# Patient Record
Sex: Male | Born: 1995 | Race: White | Hispanic: No | Marital: Single | Smoking: Current every day smoker
Health system: Southern US, Community
[De-identification: ages and names within clinical notes are randomized; demographics above are authoritative.]

## PROBLEM LIST (undated history)

## (undated) DIAGNOSIS — Q239 Congenital malformation of aortic and mitral valves, unspecified: Secondary | ICD-10-CM

## (undated) DIAGNOSIS — Q2381 Bicuspid aortic valve: Secondary | ICD-10-CM

## (undated) DIAGNOSIS — Q238 Other congenital malformations of aortic and mitral valves: Secondary | ICD-10-CM

## (undated) DIAGNOSIS — Q231 Congenital insufficiency of aortic valve: Secondary | ICD-10-CM

## (undated) HISTORY — PX: WISDOM TOOTH EXTRACTION: SHX21

## (undated) HISTORY — DX: Bicuspid aortic valve: Q23.81

## (undated) HISTORY — DX: Congenital insufficiency of aortic valve: Q23.1

---
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
## 2018-09-23 ENCOUNTER — Encounter: Payer: Self-pay | Admitting: Physician Assistant

## 2018-09-23 ENCOUNTER — Ambulatory Visit: Payer: Self-pay | Admitting: Physician Assistant

## 2018-11-15 NOTE — Progress Notes (Signed)
NEW PATIENT OV  Assessment and Plan: Michael Walker was seen today for new patient (initial visit).  Screen for STD (sexually transmitted disease) -     C. trachomatis/N. gonorrhoeae RNA -     RPR -     HIV Antibody (routine testing w rflx) -     HSV(herpes simplex vrs) 1+2 ab-IgG  Bicuspid aortic valve  no symptoms, no murmur, will monitor Need to monitor BP Follow up 6 months Will need echo eventually  Screening cholesterol level -     Lipid panel  Screening for diabetes mellitus -     Hemoglobin A1c  Vitamin D deficiency -     VITAMIN D 25 Hydroxy (Vit-D Deficiency, Fractures)  Medication management -     CBC with Differential/Platelet -     COMPLETE METABOLIC PANEL WITH GFR -     Magnesium  Screening for thyroid disorder -     TSH  Screening, anemia, deficiency, iron -     CBC with Differential/Platelet -     Iron,Total/Total Iron Binding Cap -     Vitamin B12 -     Ferritin  Screening for hematuria or proteinuria -     Urinalysis, Routine w reflex microscopic -     Microalbumin / creatinine urine ratio  Genital wart Rather large, will schedule to see Dr. Melford Aase for possible removal  Discussed med's effects and SE's. Screening labs and tests as requested with regular follow-up as recommended. Over 40 minutes of exam, counseling, chart review and critical decision making was performed  HPI Patient presents as new patient.  He has no issues.  He has history of bicuspid valve, no SOB, CP, dizziness.  He would like STD check. He is concerned for genital warts. No pain, noticed 2 weeks ago.   He did smoke on the way here, BP was up, recheck was 130/70.   Current Medications:  No current outpatient medications on file prior to visit.   No current facility-administered medications on file prior to visit.    Allergies:  No Known Allergies   Health Maintenance:  Immunization History  Administered Date(s) Administered  . Tdap 11/15/2009    Tetanus: 2011 Get  immuninizations from peds  DEXA: Colonoscopy: EGD:  Patient Care Team: Unk Pinto, MD as PCP - General (Internal Medicine)  Medical History:  has Bicuspid aortic valve on their problem list. Surgical History:  He  has a past surgical history that includes Tonsillectomy and adenoidectomy (2004) and Wisdom tooth extraction. Family History:  His family history includes Diabetes in his maternal grandmother and mother. Social History:   reports that he has never smoked. He has never used smokeless tobacco. He reports that he does not drink alcohol or use drugs.   Review of Systems:  Review of Systems  Constitutional: Negative.   HENT: Negative.   Eyes: Negative.   Respiratory: Negative.   Cardiovascular: Negative.   Gastrointestinal: Negative.   Genitourinary: Negative.   Musculoskeletal: Negative.   Skin: Negative.   Neurological: Negative.   Endo/Heme/Allergies: Negative.   Psychiatric/Behavioral: Negative.     Physical Exam: Estimated body mass index is 27.06 kg/m as calculated from the following:   Height as of this encounter: 5' 9.25" (1.759 m).   Weight as of this encounter: 184 lb 9.6 oz (83.7 kg). BP (!) 148/92   Pulse 80   Temp (!) 97.4 F (36.3 C)   Resp 16   Ht 5' 9.25" (1.759 m)   Wt 184 lb 9.6 oz (  83.7 kg)   BMI 27.06 kg/m  General Appearance: Well nourished, in no apparent distress.  Eyes: PERRLA, EOMs, conjunctiva no swelling or erythema, normal fundi and vessels.  Sinuses: No Frontal/maxillary tenderness  ENT/Mouth: Ext aud canals clear, normal light reflex with TMs without erythema, bulging. Good dentition. No erythema, swelling, or exudate on post pharynx. Tonsils not swollen or erythematous. Hearing normal.  Neck: Supple, thyroid normal. No bruits  Respiratory: Respiratory effort normal, BS equal bilaterally without rales, rhonchi, wheezing or stridor.  Cardio: RRR without murmurs, rubs or gallops. Brisk peripheral pulses without edema.   Chest: symmetric, with normal excursions and percussion.  Abdomen: Soft, nontender, no guarding, rebound, hernias, masses, or organomegaly.  Lymphatics: Non tender without lymphadenopathy.  Genitourinary: normal testicles, circumcised penis without discharge, on the underside of the penis with 5 mm pedunculated flesh colored nodule  Musculoskeletal: Full ROM all peripheral extremities,5/5 strength, and normal gait.  Skin: Warm, dry without rashes, lesions, ecchymosis. Neuro: Cranial nerves intact, reflexes equal bilaterally. Normal muscle tone, no cerebellar symptoms. Sensation intact.  Psych: Awake and oriented X 3, normal affect, Insight and Judgment appropriate.    Michael MullingAmanda Aniza Walker 12:15 PM [facility]

## 2018-11-16 ENCOUNTER — Ambulatory Visit (INDEPENDENT_AMBULATORY_CARE_PROVIDER_SITE_OTHER): Payer: Managed Care, Other (non HMO) | Admitting: Physician Assistant

## 2018-11-16 ENCOUNTER — Encounter: Payer: Self-pay | Admitting: Physician Assistant

## 2018-11-16 ENCOUNTER — Other Ambulatory Visit: Payer: Self-pay

## 2018-11-16 VITALS — BP 148/92 | HR 80 | Temp 97.4°F | Resp 16 | Ht 69.25 in | Wt 184.6 lb

## 2018-11-16 DIAGNOSIS — Z13220 Encounter for screening for lipoid disorders: Secondary | ICD-10-CM | POA: Diagnosis not present

## 2018-11-16 DIAGNOSIS — Q231 Congenital insufficiency of aortic valve: Secondary | ICD-10-CM | POA: Diagnosis not present

## 2018-11-16 DIAGNOSIS — Z79899 Other long term (current) drug therapy: Secondary | ICD-10-CM

## 2018-11-16 DIAGNOSIS — Z1389 Encounter for screening for other disorder: Secondary | ICD-10-CM

## 2018-11-16 DIAGNOSIS — E559 Vitamin D deficiency, unspecified: Secondary | ICD-10-CM

## 2018-11-16 DIAGNOSIS — Z1329 Encounter for screening for other suspected endocrine disorder: Secondary | ICD-10-CM

## 2018-11-16 DIAGNOSIS — Z131 Encounter for screening for diabetes mellitus: Secondary | ICD-10-CM

## 2018-11-16 DIAGNOSIS — Z130 Encounter for screening for diseases of the blood and blood-forming organs and certain disorders involving the immune mechanism: Secondary | ICD-10-CM

## 2018-11-16 DIAGNOSIS — Z113 Encounter for screening for infections with a predominantly sexual mode of transmission: Secondary | ICD-10-CM

## 2018-11-16 NOTE — Patient Instructions (Addendum)
Check out 7 min work out by [employer]  HYPERTENSION INFORMATION  Monitor your blood pressure at home, please keep a record and bring that in with you to your next office visit.   Go to the ER if any CP, SOB, nausea, dizziness, severe HA, changes vision/speech  Testing/Procedures: HOW TO TAKE YOUR BLOOD PRESSURE:  Rest 5 minutes before taking your blood pressure.  Don't smoke or drink caffeinated beverages for at least 30 minutes before.  Take your blood pressure before (not after) you eat.  Sit comfortably with your back supported and both feet on the floor (don't cross your legs).  Elevate your arm to heart level on a table or a desk.  Use the proper sized cuff. It should fit smoothly and snugly around your bare upper arm. There should be enough room to slip a fingertip under the cuff. The bottom edge of the cuff should be 1 inch above the crease of the elbow.  Due to a recent study, SPRINT, we have changed our goal for the systolic or top blood pressure number. Ideally we want your top number at 120.  In the [facility], 5000 people were randomized to a goal BP of 120 and 5000 people were randomized to a goal BP of less than 140. The patients with the goal BP at 120 had LESS DEMENTIA, LESS HEART ATTACKS, AND LESS STROKES, AS WELL AS OVERALL DECREASED MORTALITY OR DEATH RATE.   There was another study that showed taking your blood pressure medications at night decrease cardiovascular events.  However if you are on a fluid pill, please take this in the morning.   If you are willing, our goal BP is the top number of 120.  Your most recent BP: BP: (!) 148/92   Take your medications faithfully as instructed. Maintain a healthy weight. Get at least 150 minutes of aerobic exercise per week. Minimize salt intake. Minimize alcohol intake  DASH Eating Plan DASH stands for "Dietary Approaches to Stop Hypertension." The DASH eating plan is a healthy eating plan that has been  shown to reduce high blood pressure (hypertension). Additional health benefits may include reducing the risk of type 2 diabetes mellitus, heart disease, and stroke. The DASH eating plan may also help with weight loss. WHAT DO I NEED TO KNOW ABOUT THE DASH EATING PLAN? For the DASH eating plan, you will follow these general guidelines:  Choose foods with a percent daily value for sodium of less than 5% (as listed on the food label).  Use salt-free seasonings or herbs instead of table salt or sea salt.  Check with your health care provider or pharmacist before using salt substitutes.  Eat lower-sodium products, often labeled as "lower sodium" or "no salt added."  Eat fresh foods.  Eat more vegetables, fruits, and low-fat dairy products.  Choose whole grains. Look for the word "whole" as the first word in the ingredient list.  Choose fish and skinless chicken or [place] more often than red meat. Limit fish, poultry, and meat to 6 oz (170 g) each day.  Limit sweets, desserts, sugars, and sugary drinks.  Choose heart-healthy fats.  Limit cheese to 1 oz (28 g) per day.  Eat more home-cooked food and less restaurant, buffet, and fast food.  Limit fried foods.  Cook foods using methods other than frying.  Limit canned vegetables. If you do use them, rinse them well to decrease the sodium.  When eating at a restaurant, ask that your food be  prepared with less salt, or no salt if possible. WHAT FOODS CAN I EAT? Seek help from a dietitian for individual calorie needs. Grains Whole grain or whole wheat bread. Brown rice. Whole grain or whole wheat pasta. Quinoa, bulgur, and whole grain cereals. Low-sodium cereals. Corn or whole wheat flour tortillas. Whole grain cornbread. Whole grain crackers. Low-sodium crackers. Vegetables Fresh or frozen vegetables (raw, steamed, roasted, or grilled). Low-sodium or reduced-sodium tomato and vegetable juices. Low-sodium or reduced-sodium tomato sauce  and paste. Low-sodium or reduced-sodium canned vegetables.  Fruits All fresh, canned (in natural juice), or frozen fruits. Meat and Other Protein Products Ground beef (85% or leaner), grass-fed beef, or beef trimmed of fat. Skinless chicken or [place]. Ground chicken or [place]. Pork trimmed of fat. All fish and seafood. Eggs. Dried beans, peas, or lentils. Unsalted nuts and seeds. Unsalted canned beans. Dairy Low-fat dairy products, such as skim or 1% milk, 2% or reduced-fat cheeses, low-fat ricotta or cottage cheese, or plain low-fat yogurt. Low-sodium or reduced-sodium cheeses. Fats and Oils Tub margarines without trans fats. Light or reduced-fat mayonnaise and salad dressings (reduced sodium). Avocado. Safflower, olive, or canola oils. Natural peanut or almond butter. Other Unsalted popcorn and pretzels. The items listed above may not be a complete list of recommended foods or beverages. Contact your dietitian for more options. WHAT FOODS ARE NOT RECOMMENDED? Grains White bread. White pasta. White rice. Refined cornbread. Bagels and croissants. Crackers that contain trans fat. Vegetables Creamed or fried vegetables. Vegetables in a cheese sauce. Regular canned vegetables. Regular canned tomato sauce and paste. Regular tomato and vegetable juices. Fruits Dried fruits. Canned fruit in light or heavy syrup. Fruit juice. Meat and Other Protein Products Fatty cuts of meat. Ribs, chicken wings, bacon, sausage, bologna, salami, chitterlings, fatback, hot dogs, bratwurst, and packaged luncheon meats. Salted nuts and seeds. Canned beans with salt. Dairy Whole or 2% milk, cream, half-and-half, and cream cheese. Whole-fat or sweetened yogurt. Full-fat cheeses or blue cheese. Nondairy creamers and whipped toppings. Processed cheese, cheese spreads, or cheese curds. Condiments Onion and garlic salt, seasoned salt, table salt, and sea salt. Canned and packaged gravies. Worcestershire sauce. Tartar sauce.  Barbecue sauce. Teriyaki sauce. Soy sauce, including reduced sodium. Steak sauce. Fish sauce. Oyster sauce. Cocktail sauce. Horseradish. Ketchup and mustard. Meat flavorings and tenderizers. Bouillon cubes. Hot sauce. Tabasco sauce. Marinades. Taco seasonings. Relishes. Fats and Oils Butter, stick margarine, lard, shortening, ghee, and bacon fat. Coconut, palm kernel, or palm oils. Regular salad dressings. Other Pickles and olives. Salted popcorn and pretzels. The items listed above may not be a complete list of foods and beverages to avoid. Contact your dietitian for more information. WHERE CAN I FIND MORE INFORMATION? National Heart, Lung, and Blood Institute: travelstabloid.com Document Released: 05/01/2011 Document Revised: 09/26/2013 Document Reviewed: 03/16/2013 [facility] Patient Information 2015 [place], [place]. This information is not intended to replace advice given to you by your health care provider. Make sure you discuss any questions you have with your health care provider.  GENERAL HEALTH GOALS  Know what a healthy weight is for you (roughly BMI <25) and aim to maintain this  Aim for 7+ servings of fruits and vegetables daily  70-80+ fluid ounces of water or unsweet tea for healthy kidneys  Limit to max 1 drink of alcohol per day; avoid smoking/tobacco  Limit animal fats in diet for cholesterol and heart health - choose grass fed whenever available  Avoid highly processed foods, and foods high in saturated/trans fats  Aim for  low stress - take time to unwind and care for your mental health  Aim for 150 min of moderate intensity exercise weekly for heart health, and weights twice weekly for bone health  Aim for 7-9 hours of sleep daily

## 2018-11-17 LAB — COMPLETE METABOLIC PANEL WITH GFR
AG Ratio: 2 (calc) (ref 1.0–2.5)
ALT: 14 U/L (ref 9–46)
AST: 14 U/L (ref 10–40)
Albumin: 5.1 g/dL (ref 3.6–5.1)
Alkaline phosphatase (APISO): 62 U/L (ref 36–130)
BUN: 12 mg/dL (ref 7–25)
CO2: 26 mmol/L (ref 20–32)
Calcium: 10.3 mg/dL (ref 8.6–10.3)
Chloride: 102 mmol/L (ref 98–110)
Creat: 0.98 mg/dL (ref 0.60–1.35)
GFR, Est African American: 128 mL/min/{1.73_m2} (ref >=60)
GFR, Est Non African American: 111 mL/min/{1.73_m2} (ref >=60)
Globulin: 2.5 g/dL (calc) (ref 1.9–3.7)
Glucose, Bld: 95 mg/dL (ref 65–99)
Potassium: 3.9 mmol/L (ref 3.5–5.3)
Sodium: 141 mmol/L (ref 135–146)
Total Bilirubin: 1.9 mg/dL — ABNORMAL HIGH (ref 0.2–1.2)
Total Protein: 7.6 g/dL (ref 6.1–8.1)

## 2018-11-17 LAB — CBC WITH DIFFERENTIAL/PLATELET
Absolute Monocytes: 382 cells/uL (ref 200–950)
Basophils Absolute: 29 cells/uL (ref 0–200)
Basophils Relative: 0.4 %
Eosinophils Absolute: 72 cells/uL (ref 15–500)
Eosinophils Relative: 1 %
HCT: 50.8 % — ABNORMAL HIGH (ref 38.5–50.0)
Hemoglobin: 17.8 g/dL — ABNORMAL HIGH (ref 13.2–17.1)
Lymphs Abs: 1944 cells/uL (ref 850–3900)
MCH: 30.5 pg (ref 27.0–33.0)
MCHC: 35 g/dL (ref 32.0–36.0)
MCV: 87 fL (ref 80.0–100.0)
MPV: 10.6 fL (ref 7.5–12.5)
Monocytes Relative: 5.3 %
Neutro Abs: 4774 cells/uL (ref 1500–7800)
Neutrophils Relative %: 66.3 %
Platelets: 211 10*3/uL (ref 140–400)
RBC: 5.84 10*6/uL — ABNORMAL HIGH (ref 4.20–5.80)
RDW: 12.4 % (ref 11.0–15.0)
Total Lymphocyte: 27 %
WBC: 7.2 10*3/uL (ref 3.8–10.8)

## 2018-11-17 LAB — URINALYSIS, ROUTINE W REFLEX MICROSCOPIC
Bilirubin Urine: NEGATIVE
Glucose, UA: NEGATIVE
Hgb urine dipstick: NEGATIVE
Ketones, ur: NEGATIVE
Leukocytes,Ua: NEGATIVE
Nitrite: NEGATIVE
Protein, ur: NEGATIVE
Specific Gravity, Urine: 1.011 (ref 1.001–1.03)
pH: 5.5 (ref 5.0–8.0)

## 2018-11-17 LAB — LIPID PANEL
Cholesterol: 155 mg/dL (ref <200)
HDL: 36 mg/dL — ABNORMAL LOW (ref >=40)
LDL Cholesterol (Calc): 95 mg/dL (calc)
Non-HDL Cholesterol (Calc): 119 mg/dL (calc) (ref <130)
Total CHOL/HDL Ratio: 4.3 (calc) (ref <5.0)
Triglycerides: 142 mg/dL (ref <150)

## 2018-11-17 LAB — HSV(HERPES SIMPLEX VRS) I + II AB-IGG
HAV 1 IGG,TYPE SPECIFIC AB: 37.4 index — ABNORMAL HIGH
HSV 2 IGG,TYPE SPECIFIC AB: <0.9 index

## 2018-11-17 LAB — HEMOGLOBIN A1C
Hgb A1c MFr Bld: 5 % of total Hgb (ref <5.7)
Mean Plasma Glucose: 97 (calc)
eAG (mmol/L): 5.4 (calc)

## 2018-11-17 LAB — RPR: RPR Ser Ql: NONREACTIVE

## 2018-11-17 LAB — HIV ANTIBODY (ROUTINE TESTING W REFLEX): HIV 1&2 Ab, 4th Generation: NONREACTIVE

## 2018-11-17 LAB — VITAMIN D 25 HYDROXY (VIT D DEFICIENCY, FRACTURES): Vit D, 25-Hydroxy: 30 ng/mL (ref 30–100)

## 2018-11-17 LAB — MICROALBUMIN / CREATININE URINE RATIO
Creatinine, Urine: 84 mg/dL (ref 20–320)
Microalb Creat Ratio: 5 mcg/mg creat (ref <30)
Microalb, Ur: 0.4 mg/dL

## 2018-11-17 LAB — FERRITIN: Ferritin: 159 ng/mL (ref 38–380)

## 2018-11-17 LAB — MAGNESIUM: Magnesium: 1.8 mg/dL (ref 1.5–2.5)

## 2018-11-17 LAB — IRON, TOTAL/TOTAL IRON BINDING CAP
%SAT: 49 % (calc) — ABNORMAL HIGH (ref 20–48)
TIBC: 372 mcg/dL (calc) (ref 250–425)

## 2018-11-17 LAB — C. TRACHOMATIS/N. GONORRHOEAE RNA
C. trachomatis RNA, TMA: NOT DETECTED
N. gonorrhoeae RNA, TMA: NOT DETECTED

## 2018-11-17 LAB — IRON,?TOTAL/TOTAL IRON BINDING CAP: Iron: 184 ug/dL (ref 50–195)

## 2018-11-17 LAB — VITAMIN B12: Vitamin B-12: 481 pg/mL (ref 200–1100)

## 2018-11-17 LAB — TSH: TSH: 2.86 mIU/L (ref 0.40–4.50)

## 2018-11-22 ENCOUNTER — Encounter (INDEPENDENT_AMBULATORY_CARE_PROVIDER_SITE_OTHER): Payer: Self-pay

## 2019-01-09 NOTE — Progress Notes (Signed)
   Subjective:    Patient ID: Michael Walker, male    DOB: 10-14-1995, 23 y.o.   MRN: [id number]  HPI    Patient is a very nice 23 yo single WM present with an irritated broad based skin tag on the Left lateral penile shaft. Patient has had negative STD testing in June.  No outpatient medications prior to visit.   No Known Allergies  Past Medical History:  Diagnosis Date  . Aortic valve cusp abnormality   . Bicuspid aortic valve    Review of Systems     Objective:   Physical Exam  BP 128/84   Pulse 76   Temp (!) 97.4 F (36.3 C)   Resp 16   Ht 5' 9.25" (1.759 m)   Wt 188 lb 9.6 oz (85.5 kg)   BMI 27.65 kg/m   Directed exam finds a broad based 1 cm pedunculated smooth lesion of the Lateral Left mid penile shaft.   Procedure (CPT- 11200)    After informed consent and aseptic prep and local anesthesia with 0.7 ml of Marcaine 0.5%, the lesion was placed in traction while the base was sharply excised by cutting hyfrecation. The woung bsae was 1 cm cross-section and painter with New Skin. When dried, antibiotic ung & non-stick pad wad secured with Paper tape.     Assessment & Plan:   1. Skin tag

## 2019-01-10 ENCOUNTER — Other Ambulatory Visit: Payer: Self-pay

## 2019-01-10 ENCOUNTER — Encounter: Payer: Self-pay | Admitting: Internal Medicine

## 2019-01-10 ENCOUNTER — Ambulatory Visit (INDEPENDENT_AMBULATORY_CARE_PROVIDER_SITE_OTHER): Payer: Managed Care, Other (non HMO) | Admitting: Internal Medicine

## 2019-01-10 VITALS — BP 128/84 | HR 76 | Temp 97.4°F | Resp 16 | Ht 69.25 in | Wt 188.6 lb

## 2019-01-10 DIAGNOSIS — L918 Other hypertrophic disorders of the skin: Secondary | ICD-10-CM | POA: Diagnosis not present

## 2019-05-23 DIAGNOSIS — Z87891 Personal history of nicotine dependence: Secondary | ICD-10-CM | POA: Insufficient documentation

## 2019-05-23 NOTE — Progress Notes (Signed)
Complete Physical  Assessment and Plan:  Claron was seen today for annual exam.  Diagnoses and all orders for this visit:  Encounter for routine adult health examination without abnormal findings Encourgaed to get flu vaccine at pharmacy; out in office today Reviewed general health and safety information including safe sex practices Declines gardesil at this time  Bicuspid aortic valve  no symptoms, no murmur, will monitor Need to monitor BP Follow up 6 months Will need echo eventually  Former smoker Quit 12/2018; doing well; maintenance strategies discussed  Labile blood pressure Initially elevated; improved with recheck; labile on historical value review Monitor blood pressure at home; call if consistently over 130/80 Discussed DASH diet and information provided Avoid excess of ETOH, limit caffeine Reminder to go to the ER if any CP, SOB, nausea, dizziness, severe HA, changes vision/speech, left arm numbness and tingling and jaw pain.  Vitamin D deficiency He has not initiated supplement; reviewed 5000 IU daily recommendation from last lab check Goal range of 60-100 Defer checking today  Declines labs today, did have full panel in 10/2018  Discussed med's effects and SE's. Screening labs and tests as requested with regular follow-up as recommended. Over 40 minutes of exam, counseling, chart review and critical decision making was performed  Future Appointments  Date Time Provider Department Center  05/23/2020  3:00 PM Judd Gaudier, NP [facility] None    HPI Patient presents for a complete physical. He has Bicuspid aortic valve; Former smoker; Labile blood pressure; and Vitamin D deficiency on their problem list.   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 Planning to move out next year.   He has a girlfriend, 1 month, Autumn. Declines repeat STD testing.   No concerns.   He is new to our  office this year; he had full labs in 10/2018 including negative STD screening.  Penile skin tag was removed by Dr. Oneta Rack on 01/10/2019 without complication.   He reported smoking daily at last visit; was advised to quit and reports succussful cessation as of August 2020. Using mint gum to help when needed.   BMI is Body mass index is 26.91 kg/m., he has not been working on diet and exercise, plans to restart gym after January, typically 1-2 times a day 1-1.5 hours each.  Cooks at home, no fast food unless on the road;  Fruit/granola bar, sandwich and crackers for lunch,  Dinner protein + carb + veggies  1 energy drink in AM, no coffee, rare soda, 4-5 bottles of water daily  Sleep - 7-8 hours daily Wt Readings from Last 3 Encounters:  05/24/19 182 lb 3.2 oz (82.6 kg)  01/10/19 188 lb 9.6 oz (85.5 kg)  11/16/18 184 lb 9.6 oz (83.7 kg)   He has history of bicuspid valve, no SOB, CP, dizziness.  Today their BP is BP: 134/72, recheck 134/72 manually by provider He does workout. He denies chest pain, shortness of breath, dizziness.   He is not on cholesterol medication. His cholesterol is at goal. The cholesterol last visit was:   Lab Results  Component Value Date   CHOL 155 11/16/2018   HDL 36 (L) 11/16/2018   LDLCALC 95 11/16/2018   TRIG 142 11/16/2018   CHOLHDL 4.3 11/16/2018   He has been working on diet and exercise for glucose management, denies nausea, paresthesia of the feet, polydipsia, polyuria, visual disturbances and weight loss. Last A1C in the office was:  Lab Results  Component Value Date   HGBA1C 5.0 11/16/2018   Had normal TSH recently:  Lab Results  Component Value Date   TSH 2.86 11/16/2018   Last GFR: Lab Results  Component Value Date   [facility] 111 11/16/2018    Patient is not on Vitamin D supplement, was recommended 5000 IU  Lab Results  Component Value Date   VD25OH 30 11/16/2018     Had elevated hgb/hct last check; iron/ferritin were normal; ? R/t  smoking and was advised to quit;  Lab Results  Component Value Date   WBC 7.2 11/16/2018   HGB 17.8 (H) 11/16/2018   HCT 50.8 (H) 11/16/2018   MCV 87.0 11/16/2018   PLT 211 11/16/2018   Lab Results  Component Value Date   IRON 184 11/16/2018   TIBC 372 11/16/2018   FERRITIN 159 11/16/2018   Lab Results  Component Value Date   [id number] 098 11/16/2018     Current Medications:  No current outpatient medications on file prior to visit.   No current facility-administered medications on file prior to visit.   Allergies:  No Known Allergies Health Maintenance:  Immunization History  Administered Date(s) Administered  . Tdap 11/15/2009    Tetanus: 10/2009 Flu vaccine: never, out in office  HPV: Declines   Colonoscopy: n/a EGD: n/a  Eye Exam: [facility] on [address], glasses, last visit 12/2018, goes annually Dentist: Dr. Marland Kitchen 2020, goes q72m  Patient Care Team: Unk Pinto, MD as PCP - General (Internal Medicine)  Medical History:  has Bicuspid aortic valve; Former smoker; Labile blood pressure; and Vitamin D deficiency on their problem list. Surgical History:  He  has a past surgical history that includes Tonsillectomy and adenoidectomy (2004) and Wisdom tooth extraction. Family History:  His family history includes Diabetes in his maternal grandmother, mother, and paternal uncle. Social History:   reports that he quit smoking about 4 months ago. His smoking use included e-cigarettes. He has never used smokeless tobacco. He reports current alcohol use of about 7.0 standard drinks of alcohol per week. He reports previous drug use. Drug: Marijuana. Review of Systems:  Review of Systems  Constitutional: Negative for malaise/fatigue and weight loss.  HENT: Negative for hearing loss and tinnitus.   Eyes: Negative for blurred vision and double vision.  Respiratory: Negative for cough, shortness of breath and wheezing.   Cardiovascular: Negative for chest pain,  palpitations, orthopnea, claudication and leg swelling.  Gastrointestinal: Negative for abdominal pain, blood in stool, constipation, diarrhea, heartburn, melena, nausea and vomiting.  Genitourinary: Negative.   Musculoskeletal: Negative for joint pain and myalgias.  Skin: Negative for rash.  Neurological: Negative for dizziness, tingling, sensory change, weakness and headaches.  Endo/Heme/Allergies: Negative for polydipsia.  Psychiatric/Behavioral: Negative.   All other systems reviewed and are negative.   Physical Exam: Estimated body mass index is 26.91 kg/m as calculated from the following:   Height as of this encounter: 5\' 9"  (1.753 m).   Weight as of this encounter: 182 lb 3.2 oz (82.6 kg). BP 134/72   Pulse 87   Temp 97.7 F (36.5 C)   Ht 5\' 9"  (1.753 m)   Wt 182 lb 3.2 oz (82.6 kg)   SpO2 97%   BMI 26.91 kg/m  General Appearance: Well nourished, in no apparent distress.  Eyes: PERRLA, EOMs, conjunctiva no swelling or erythema Sinuses: No Frontal/maxillary tenderness  ENT/Mouth: Ext aud canals clear, normal light reflex with TMs without erythema, bulging. Good dentition. No erythema, swelling, or exudate on  post pharynx. Tonsils not swollen or erythematous. Hearing normal.  Neck: Supple, thyroid normal. No bruits  Respiratory: Respiratory effort normal, BS equal bilaterally without rales, rhonchi, wheezing or stridor.  Cardio: RRR without murmurs, rubs or gallops. Brisk peripheral pulses without edema.  Chest: symmetric, with normal excursions and percussion.  Abdomen: Soft, nontender, no guarding, rebound, hernias, masses, or organomegaly.  Lymphatics: Non tender without lymphadenopathy.  Genitourinary: no concerns, defer Musculoskeletal: Full ROM all peripheral extremities,5/5 strength, and normal gait.  Skin: Warm, dry without rashes, lesions, ecchymosis. Neuro: Cranial nerves intact, reflexes equal bilaterally. Normal muscle tone, no cerebellar symptoms. Sensation  intact.  Psych: Awake and oriented X 3, normal affect, Insight and Judgment appropriate.   EKG: Defer  Dan MakerAshley C Gyanna Jarema 3:51 PM [facility]

## 2019-05-24 ENCOUNTER — Ambulatory Visit (INDEPENDENT_AMBULATORY_CARE_PROVIDER_SITE_OTHER): Payer: Managed Care, Other (non HMO) | Admitting: Adult Health

## 2019-05-24 ENCOUNTER — Other Ambulatory Visit: Payer: Self-pay

## 2019-05-24 ENCOUNTER — Encounter: Payer: Self-pay | Admitting: Adult Health

## 2019-05-24 VITALS — BP 134/72 | HR 87 | Temp 97.7°F | Ht 69.0 in | Wt 182.2 lb

## 2019-05-24 DIAGNOSIS — R0989 Other specified symptoms and signs involving the circulatory and respiratory systems: Secondary | ICD-10-CM | POA: Insufficient documentation

## 2019-05-24 DIAGNOSIS — Z0000 Encounter for general adult medical examination without abnormal findings: Secondary | ICD-10-CM | POA: Diagnosis not present

## 2019-05-24 DIAGNOSIS — E559 Vitamin D deficiency, unspecified: Secondary | ICD-10-CM

## 2019-05-24 DIAGNOSIS — Z87891 Personal history of nicotine dependence: Secondary | ICD-10-CM

## 2019-05-24 DIAGNOSIS — Q231 Congenital insufficiency of aortic valve: Secondary | ICD-10-CM

## 2019-05-24 NOTE — Patient Instructions (Addendum)
[name] , Thank you for taking time to come for your Annual Wellness Visit. I appreciate your ongoing commitment to your health goals. Please review the following plan we discussed and let me know if I can assist you in the future.   These are the goals we discussed: Goals    . Blood Pressure < 130/80    . DIET - EAT MORE FRUITS AND VEGETABLES     7 servings daily (1/2 cup per serving)       This is a list of the screening recommended for you and due dates:  Health Maintenance  Topic Date Due  . Flu Shot  12/25/2018  . Tetanus Vaccine  11/16/2019  . HIV Screening  Completed     Get on 5000 IU of vitamin D daily   Consider checking blood pressure occasionally to make sure   Consider getting flu vaccine - before [place]     Know what a healthy weight is for you (roughly BMI <25) and aim to maintain this  Aim for 7+ servings of fruits and vegetables daily  65-80+ fluid ounces of water or unsweet tea for healthy kidneys  Limit to max 1 drink of alcohol per day; avoid smoking/tobacco  Limit animal fats in diet for cholesterol and heart health - choose grass fed whenever available  Avoid highly processed foods, and foods high in saturated/trans fats  Aim for low stress - take time to unwind and care for your mental health  Aim for 150 min of moderate intensity exercise weekly for heart health, and weights twice weekly for bone health  Aim for 7-9 hours of sleep daily     HYPERTENSION INFORMATION  Monitor your blood pressure at home, please keep a record and bring that in with you to your next office visit.   Go to the ER if any CP, SOB, nausea, dizziness, severe HA, changes vision/speech  Testing/Procedures: HOW TO TAKE YOUR BLOOD PRESSURE:  Rest 5 minutes before taking your blood pressure.  Don't smoke or drink caffeinated beverages for at least 30 minutes before.  Take your blood pressure before (not after) you eat.  Sit comfortably with your back  supported and both feet on the floor (don't cross your legs).  Elevate your arm to heart level on a table or a desk.  Use the proper sized cuff. It should fit smoothly and snugly around your bare upper arm. There should be enough room to slip a fingertip under the cuff. The bottom edge of the cuff should be 1 inch above the crease of the elbow.  Due to a recent study, SPRINT, we have changed our goal for the systolic or top blood pressure number. Ideally we want your top number at 120.  In the [facility] Trial, 5000 people were randomized to a goal BP of 120 and 5000 people were randomized to a goal BP of less than 140. The patients with the goal BP at 120 had LESS DEMENTIA, LESS HEART ATTACKS, AND LESS STROKES, AS WELL AS OVERALL DECREASED MORTALITY OR DEATH RATE.   There was another study that showed taking your blood pressure medications at night decrease cardiovascular events.  However if you are on a fluid pill, please take this in the morning.   If you are willing, our goal BP is the top number of 120.  Your most recent BP: BP: (!) 154/98   Take your medications faithfully as instructed. Maintain a healthy weight. Get at least 150 minutes of  aerobic exercise per week. Minimize salt intake. Minimize alcohol intake  DASH Eating Plan DASH stands for "Dietary Approaches to Stop Hypertension." The DASH eating plan is a healthy eating plan that has been shown to reduce high blood pressure (hypertension). Additional health benefits may include reducing the risk of type 2 diabetes mellitus, heart disease, and stroke. The DASH eating plan may also help with weight loss. WHAT DO I NEED TO KNOW ABOUT THE DASH EATING PLAN? For the DASH eating plan, you will follow these general guidelines:  Choose foods with a percent daily value for sodium of less than 5% (as listed on the food label).  Use salt-free seasonings or herbs instead of table salt or sea salt.  Check with your health care provider or  pharmacist before using salt substitutes.  Eat lower-sodium products, often labeled as "lower sodium" or "no salt added."  Eat fresh foods.  Eat more vegetables, fruits, and low-fat dairy products.  Choose whole grains. Look for the word "whole" as the first word in the ingredient list.  Choose fish and skinless chicken or [place] more often than red meat. Limit fish, poultry, and meat to 6 oz (170 g) each day.  Limit sweets, desserts, sugars, and sugary drinks.  Choose heart-healthy fats.  Limit cheese to 1 oz (28 g) per day.  Eat more home-cooked food and less restaurant, buffet, and fast food.  Limit fried foods.  Cook foods using methods other than frying.  Limit canned vegetables. If you do use them, rinse them well to decrease the sodium.  When eating at a restaurant, ask that your food be prepared with less salt, or no salt if possible. WHAT FOODS CAN I EAT? Seek help from a dietitian for individual calorie needs. Grains Whole grain or whole wheat bread. Brown rice. Whole grain or whole wheat pasta. Quinoa, bulgur, and whole grain cereals. Low-sodium cereals. Corn or whole wheat flour tortillas. Whole grain cornbread. Whole grain crackers. Low-sodium crackers. Vegetables Fresh or frozen vegetables (raw, steamed, roasted, or grilled). Low-sodium or reduced-sodium tomato and vegetable juices. Low-sodium or reduced-sodium tomato sauce and paste. Low-sodium or reduced-sodium canned vegetables.  Fruits All fresh, canned (in natural juice), or frozen fruits. Meat and Other Protein Products Ground beef (85% or leaner), grass-fed beef, or beef trimmed of fat. Skinless chicken or [place]. Ground chicken or [place]. Pork trimmed of fat. All fish and seafood. Eggs. Dried beans, peas, or lentils. Unsalted nuts and seeds. Unsalted canned beans. Dairy Low-fat dairy products, such as skim or 1% milk, 2% or reduced-fat cheeses, low-fat ricotta or cottage cheese, or plain low-fat yogurt.  Low-sodium or reduced-sodium cheeses. Fats and Oils Tub margarines without trans fats. Light or reduced-fat mayonnaise and salad dressings (reduced sodium). Avocado. Safflower, olive, or canola oils. Natural peanut or almond butter. Other Unsalted popcorn and pretzels. The items listed above may not be a complete list of recommended foods or beverages. Contact your dietitian for more options. WHAT FOODS ARE NOT RECOMMENDED? Grains White bread. White pasta. White rice. Refined cornbread. Bagels and croissants. Crackers that contain trans fat. Vegetables Creamed or fried vegetables. Vegetables in a cheese sauce. Regular canned vegetables. Regular canned tomato sauce and paste. Regular tomato and vegetable juices. Fruits Dried fruits. Canned fruit in light or heavy syrup. Fruit juice. Meat and Other Protein Products Fatty cuts of meat. Ribs, chicken wings, bacon, sausage, bologna, salami, chitterlings, fatback, hot dogs, bratwurst, and packaged luncheon meats. Salted nuts and seeds. Canned beans with salt. Dairy Whole or  2% milk, cream, half-and-half, and cream cheese. Whole-fat or sweetened yogurt. Full-fat cheeses or blue cheese. Nondairy creamers and whipped toppings. Processed cheese, cheese spreads, or cheese curds. Condiments Onion and garlic salt, seasoned salt, table salt, and sea salt. Canned and packaged gravies. Worcestershire sauce. Tartar sauce. Barbecue sauce. Teriyaki sauce. Soy sauce, including reduced sodium. Steak sauce. Fish sauce. Oyster sauce. Cocktail sauce. Horseradish. Ketchup and mustard. Meat flavorings and tenderizers. Bouillon cubes. Hot sauce. Tabasco sauce. Marinades. Taco seasonings. Relishes. Fats and Oils Butter, stick margarine, lard, shortening, ghee, and bacon fat. Coconut, palm kernel, or palm oils. Regular salad dressings. Other Pickles and olives. Salted popcorn and pretzels. The items listed above may not be a complete list of foods and beverages to avoid.  Contact your dietitian for more information. WHERE CAN I FIND MORE INFORMATION? National Heart, Lung, and Blood Institute: [employer] Document Released: 05/01/2011 Document Revised: 09/26/2013 Document Reviewed: 03/16/2013 [facility] Patient Information 2015 [place], [place]. This information is not intended to replace advice given to you by your health care provider. Make sure you discuss any questions you have with your health care provider.

## 2020-05-23 ENCOUNTER — Other Ambulatory Visit: Payer: Self-pay

## 2020-05-23 ENCOUNTER — Encounter: Payer: Self-pay | Admitting: Adult Health

## 2020-05-23 ENCOUNTER — Ambulatory Visit: Payer: BC Managed Care – PPO | Admitting: Adult Health

## 2020-05-23 ENCOUNTER — Encounter: Payer: Managed Care, Other (non HMO) | Admitting: Adult Health

## 2020-05-23 VITALS — BP 140/90 | HR 83 | Temp 97.9°F | Ht 69.0 in | Wt 191.0 lb

## 2020-05-23 DIAGNOSIS — J029 Acute pharyngitis, unspecified: Secondary | ICD-10-CM

## 2020-05-23 DIAGNOSIS — J302 Other seasonal allergic rhinitis: Secondary | ICD-10-CM

## 2020-05-23 DIAGNOSIS — J039 Acute tonsillitis, unspecified: Secondary | ICD-10-CM

## 2020-05-23 LAB — POCT RAPID STREP A (OFFICE): Rapid Strep A Screen: NEGATIVE

## 2020-05-23 MED ORDER — AMOXICILLIN-POT CLAVULANATE 875-125 MG PO TABS: 1.0000 | ORAL_TABLET | Freq: Two times a day (BID) | ORAL | 0 refills | Status: AC

## 2020-05-23 NOTE — Patient Instructions (Signed)
R tonsil is very enlarged  Rapid strep was negative, however very inflamed tonsil, will check a culture and proceed with presumptive treatment due to risks of bad outcomes with heart valve history    Tonsillitis  Tonsillitis is an infection of the throat that causes the tonsils to become red, tender, and swollen. Tonsils are tissues in the back of your throat. Each tonsil has crevices (crypts). Tonsils normally work to protect the body from infection. What are the causes? Sudden (acute) tonsillitis may be caused by a virus or bacteria, including streptococcal bacteria. Long-lasting (chronic) tonsillitis occurs when the crypts of the tonsils become filled with pieces of food and bacteria, which makes it easy for the tonsils to become repeatedly infected. Tonsillitis can be spread from person to person (is contagious). It may be spread by inhaling droplets that are released with coughing or sneezing. You may also come into contact with viruses or bacteria on surfaces, such as cups or utensils. What are the signs or symptoms? Symptoms of this condition include:  A sore throat. This may include trouble swallowing.  White patches on the tonsils.  Swollen tonsils.  Fever.  Headache.  Tiredness.  Loss of appetite.  Snoring during sleep when you did not snore before.  Small, foul-smelling, yellowish-white pieces of material (tonsilloliths) that you occasionally cough up or spit out. These can cause you to have bad breath. How is this diagnosed? This condition is diagnosed with a physical exam. Diagnosis can be confirmed with the results of lab tests, including a throat culture. How is this treated? Treatment for this condition depends on the cause, but usually focuses on treating the symptoms associated with it. Treatment may include:  Medicines to relieve pain and manage fever.  Steroid medicines to reduce swelling.  Antibiotic medicines if the condition is caused by bacteria. If  attacks of tonsillitis are severe and frequent, your health care provider may recommend surgery to remove the tonsils (tonsillectomy). Follow these instructions at home: Medicines  Take over-the-counter and prescription medicines only as told by your health care provider.  If you were prescribed an antibiotic medicine, take it as told by your health care provider. Do not stop taking the antibiotic even if you start to feel better. Eating and drinking  Drink enough fluid to keep your urine clear or pale yellow.  While your throat is sore, eat soft or liquid foods, such as sherbet, soups, or instant breakfast drinks.  Drink warm liquids.  Eat frozen ice pops. General instructions  Rest as much as possible and get plenty of sleep.  Gargle with a salt-water mixture 3-4 times a day or as needed. To make a salt-water mixture, completely dissolve -1 tsp of salt in 1 cup of warm water.  Wash your hands regularly with soap and water. If soap and water are not available, use hand sanitizer.  Do not share cups, bottles, or other utensils until your symptoms have gone away.  Do not smoke. This can help your symptoms and prevent the infection from coming back. If you need help quitting, ask your health care provider.  Keep all follow-up visits as told by your health care provider. This is important. Contact a health care provider if:  You notice large, tender lumps in your neck that were not there before.  You have a fever that does not go away after 2-3 days.  You develop a rash.  You cough up a green, yellow-brown, or bloody substance.  You cannot swallow liquids or  food for 24 hours.  Only one of your tonsils is swollen. Get help right away if:  You develop any new symptoms, such as vomiting, severe headache, stiff neck, chest pain, trouble breathing, or trouble swallowing.  You have severe throat pain along with drooling or voice changes.  You have severe pain that is not  controlled with medicines.  You cannot fully open your mouth.  You develop redness, swelling, or severe pain anywhere in your neck. Summary  Tonsillitis is an infection of the throat that causes the tonsils to become red, tender, and swollen.  Tonsillitis may be caused by a virus or bacteria.  Rest as much as possible. Get plenty of sleep. This information is not intended to replace advice given to you by your health care provider. Make sure you discuss any questions you have with your health care provider. Document Revised: 04/24/2017 Document Reviewed: 06/17/2016 Elsevier Patient Education  2020 Elsevier Inc.     Amoxicillin; Clavulanic Acid Tablets What is this medicine? AMOXICILLIN; CLAVULANIC ACID (a mox i SIL in; KLAV yoo lan ic AS id) is a penicillin antibiotic. It treats some infections caused by bacteria. It will not work for colds, the flu, or other viruses. This medicine may be used for other purposes; ask your health care provider or pharmacist if you have questions. COMMON BRAND NAME(S): Augmentin What should I tell my health care provider before I take this medicine? They need to know if you have any of these conditions:  bowel disease, like colitis  kidney disease  liver disease  mononucleosis  an unusual or allergic reaction to amoxicillin, penicillin, cephalosporin, other antibiotics, clavulanic acid, other medicines, foods, dyes, or preservatives  pregnant or trying to get pregnant  breast-feeding How should I use this medicine? Take this drug by mouth. Take it as directed on the prescription label at the same time every day. Take it with food at the start of a meal or snack. Take all of this drug unless your health care provider tells you to stop it early. Keep taking it even if you think you are better. Talk to your health care provider about the use of this drug in children. While it may be prescribed for selected conditions, precautions do  apply. Overdosage: If you think you have taken too much of this medicine contact a poison control center or emergency room at once. NOTE: This medicine is only for you. Do not share this medicine with others. What if I miss a dose? If you miss a dose, take it as soon as you can. If it is almost time for your next dose, take only that dose. Do not take double or extra doses. What may interact with this medicine?  allopurinol  anticoagulants  birth control pills  methotrexate  probenecid This list may not describe all possible interactions. Give your health care provider a list of all the medicines, herbs, non-prescription drugs, or dietary supplements you use. Also tell them if you smoke, drink alcohol, or use illegal drugs. Some items may interact with your medicine. What should I watch for while using this medicine? Tell your doctor or healthcare provider if your symptoms do not improve. This medicine may cause serious skin reactions. They can happen weeks to months after starting the medicine. Contact your healthcare provider right away if you notice fevers or flu-like symptoms with a rash. The rash may be red or purple and then turn into blisters or peeling of the skin. Or, you might notice  a red rash with swelling of the face, lips or lymph nodes in your neck or under your arms. Do not treat diarrhea with over the counter products. Contact your doctor if you have diarrhea that lasts more than 2 days or if it is severe and watery. If you have diabetes, you may get a false-positive result for sugar in your urine. Check with your doctor or healthcare provider. Birth control pills may not work properly while you are taking this medicine. Talk to your doctor about using an extra method of birth control. What side effects may I notice from receiving this medicine? Side effects that you should report to your doctor or health care professional as soon as possible:  allergic reactions like skin  rash, itching or hives, swelling of the face, lips, or tongue  breathing problems  dark urine  fever or chills, sore throat  redness, blistering, peeling, or loosening of the skin, including inside the mouth  seizures  trouble passing urine or change in the amount of urine  unusual bleeding, bruising  unusually weak or tired  white patches or sores in the mouth or throat Side effects that usually do not require medical attention (report to your doctor or health care professional if they continue or are bothersome):  diarrhea  dizziness  headache  nausea, vomiting  stomach upset  vaginal or anal irritation This list may not describe all possible side effects. Call your doctor for medical advice about side effects. You may report side effects to FDA at 1-800-FDA-1088. Where should I keep my medicine? Keep out of the reach of children and pets. Store at room temperature between 20 and 25 degrees C (68 and 77 degrees F). Throw away any unused drug after the expiration date. NOTE: This sheet is a summary. It may not cover all possible information. If you have questions about this medicine, talk to your doctor, pharmacist, or health care provider.  2020 Elsevier/Gold Standard (2018-12-13 11:55:53)

## 2020-05-23 NOTE — Progress Notes (Signed)
Assessment and Plan:  Diagnoses and all orders for this visit:  Acute tonsillitis, unspecified etiology Continue ibuprofen PRN; claritin for allergies/post-nasal drip Rapid strep neg, no red flags but markedly enlarged/inflamed R tonsil; will proceed with presumptive treatment pending throat culture Pharyngitis: take medications as prescribed, increase fluids,  Gentle Salt water gargles. Sugar free throat lozenges If symptoms do not improve in 3-5 days or get worse contact the office or go to ER. -     amoxicillin-clavulanate (AUGMENTIN) 875-125 MG tablet; Take 1 tablet by mouth 2 (two) times daily for 10 days. -     POCT rapid strep A -     Upper Respiratory Culture  Advised he schedule overdue CPE next year  Further disposition pending results of labs. Discussed med's effects and SE's.   Over 20 minutes of exam, counseling, chart review, and critical decision making was performed.   No future appointments.  ------------------------------------------------------------------------------------------------------------------   HPI 24 y.o.male with hx of bicuspid aortic valve and seasonal allergies presents for evaluation of sore throat/enlarged tonsil. Has had tonsillectomy/adenoidectomy documented as a child, but today states this is incorrect   He reports sx began 4-5 days ago with post- nasal drip, nasal congestion (clear), then developed sore throat.  Took 1 dose of ibuprofen and claritin, did help significantly, but stopped taking, sx resumed worse, then this AM noted white spots on this throat/R tonsil, very enlarged R tonsil, presents for evaluation per mom's recommendation  Denies sinus tenderness, fever/chills, HA, rash, nausea, cough, dyspnea, tooth pain, difficulty swallowing, pooling saliva, CP, dyspnea, palpitations, peripheral edema. Denies fatigue.   He has resumed ibuprofen/claritin and notes sx are much improved already.    Past Medical History:  Diagnosis Date    Aortic valve cusp abnormality    Bicuspid aortic valve      No Known Allergies  No current outpatient medications on file prior to visit.   No current facility-administered medications on file prior to visit.   Allergies: No Known Allergies Surgical History:  He  has a past surgical history that includes Wisdom tooth extraction. Social History:   reports that he has been smoking e-cigarettes. He has never used smokeless tobacco. He reports current alcohol use of about 7.0 standard drinks of alcohol per week. He reports previous drug use. Drug: Marijuana.   ROS: all negative except above.   Physical Exam:  BP 140/90    Pulse 83    Temp 97.9 F (36.6 C)    Ht 5\' 9"  (1.753 m)    Wt 191 lb (86.6 kg)    SpO2 97%    BMI 28.21 kg/m   General Appearance: Well nourished, well dressed young adult male in no apparent distress. Eyes: PERRLA,  conjunctiva no swelling or erythema Sinuses: No Frontal/maxillary tenderness ENT/Mouth: Ext aud canals clear, TMs without erythema, bulging. Post pharynx mildly erythematous, swelling, or exudate on post pharynx.  L tonsil 2+, mildly erythematous, R tonsil 4+ touching uvula (midline, no deviation) with erythema and scant white patches. Good oral hygiene, no obvious oral abscess. Hearing normal.  Neck: Supple Respiratory: Respiratory effort normal, BS equal bilaterally without rales, rhonchi, wheezing or stridor.  Cardio: RRR with no MRGs. Brisk peripheral pulses without edema.  Abdomen: Soft, + BS.  Non tender. Lymphatics: Non tender without lymphadenopathy.  Musculoskeletal:  normal gait.  Skin: Warm, dry without rashes, lesions, ecchymosis.  Neuro: Normal muscle tone Psych: Awake and oriented X 3, normal affect, Insight and Judgment appropriate.      Laurence Aly, NP 12:16 PM [facility]

## 2020-05-25 LAB — CULTURE, UPPER RESPIRATORY: SPECIMEN QUALITY:: ADEQUATE

## 2020-05-26 LAB — CULTURE, UPPER RESPIRATORY: MICRO NUMBER:: 11368075

## 2020-10-04 NOTE — Progress Notes (Signed)
RESCHEDULED

## 2020-10-04 NOTE — Patient Instructions (Addendum)
Epididymitis  Epididymitis is swelling (inflammation) or infection of the epididymis. The epididymis is a cord-like structure that is located along the top and back part of the testicle. It collects and stores sperm from the testicle. This condition can also cause pain and swelling of the testicle and scrotum. Symptoms usually start suddenly (acute epididymitis). Sometimes epididymitis starts gradually and lasts for a while (chronic epididymitis). This type may be harder to treat. What are the causes? In men ages 20-40, this condition is usually caused by a bacterial infection or a sexually transmitted disease (STD), such as:  Gonorrhea.  Chlamydia. In men 40 and older who do not have anal sex, this condition is usually caused by bacteria from a blockage or from abnormalities in the urinary system. These can result from:  Having a tube placed into the bladder (urinary catheter).  Having an enlarged or inflamed prostate gland.  Having recently had urinary tract surgery.  Having a problem with a backward flow of urine (retrograde). In men who have a condition that weakens the body's defense system (immune system), such as HIV, this condition can be caused by:  Other bacteria, including tuberculosis and syphilis.  Viruses.  Fungi. Sometimes this condition occurs without infection. This may happen because of trauma or repetitive activities such as sports. What increases the risk? You are more likely to develop this condition if you have:  Unprotected sex with more than one partner.  Anal sex.  Recently had surgery.  A urinary catheter.  Urinary problems.  A suppressed immune system. What are the signs or symptoms? This condition usually begins suddenly with chills, fever, and pain behind the scrotum and in the testicle. Other symptoms include:  Swelling of the scrotum, testicle, or both.  Pain when ejaculating or urinating.  Pain in the back or  abdomen.  Nausea.  Itching and discharge from the penis.  A frequent need to pass urine.  Redness, increased warmth, and tenderness of the scrotum. How is this diagnosed? Your health care provider can diagnose this condition based on your symptoms and medical history. Your health care provider will also do a physical exam to ask about your symptoms and check your scrotum and testicle for swelling, pain, and redness. You may also have other tests, including:  Examination of discharge from the penis.  Urine tests for infections, such as STDs.  Ultrasound test for blood flow and inflammation. Your health care provider may test you for other STDs, including HIV. How is this treated? Treatment for this condition depends on the cause. If your condition is caused by a bacterial infection, oral antibiotic medicine may be prescribed. If the bacterial infection has spread to your blood, you may need to receive IV antibiotics. For both bacterial and nonbacterial epididymitis, you may be treated with:  Rest.  Elevation of the scrotum.  Pain medicines.  Anti-inflammatory medicines. Surgery may be needed to treat:  Bacterial epididymitis that causes pus to build up in the scrotum (abscess).  Chronic epididymitis that has not responded to other treatments. Follow these instructions at home: Medicines  Take over-the-counter and prescription medicines only as told by your health care provider.  If you were prescribed an antibiotic medicine, take it as told by your health care provider. Do not stop taking the antibiotic even if your condition improves. Sexual activity  If your epididymitis was caused by an STD, avoid sexual activity until your treatment is complete.  Inform your sexual partner or partners if you test positive for   an STD. They may need to be treated. Do not engage in sexual activity with your partner or partners until their treatment is completed. Managing pain and  swelling  If directed, elevate your scrotum and apply ice. ? Put ice in a plastic bag. ? Place a small towel or pillow between your legs. ? Rest your scrotum on the pillow or towel. ? Place another towel between your skin and the plastic bag. ? Leave the ice on for 20 minutes, 2-3 times a day.  Try taking a sitz bath to help with discomfort. This is a warm water bath that is taken while you are sitting down. The water should only come up to your hips and should cover your buttocks. Do this 3-4 times per day or as told by your health care provider.  Keep your scrotum elevated and supported while resting. Ask your health care provider if you should wear a scrotal support, such as a jockstrap. Wear it as told by your health care provider.   General instructions  Return to your normal activities as told by your health care provider. Ask your health care provider what activities are safe for you.  Drink enough fluid to keep your urine pale yellow.  Keep all follow-up visits as told by your health care provider. This is important. Contact a health care provider if:  You have a fever.  Your pain medicine is not helping.  Your pain is getting worse.  Your symptoms do not improve within 3 days. Summary  Epididymitis is swelling (inflammation) or infection of the epididymis. This condition can also cause pain and swelling of the testicle and scrotum.  Treatment for this condition depends on the cause. If your condition is caused by a bacterial infection, oral antibiotic medicine may be prescribed.  Inform your sexual partner or partners if you test positive for an STD. They may need to be treated. Do not engage in sexual activity with your partner or partners until their treatment is completed.  Contact a health care provider if your symptoms do not improve within 3 days. This information is not intended to replace advice given to you by your health care provider. Make sure you discuss any  questions you have with your health care provider. Document Revised: 03/15/2018 Document Reviewed: 03/16/2018 Elsevier Patient Education  2021 Elsevier Inc. +++++++++++++++++++++++++++++   Spermatocele ( or Epididymal Cyst)     A spermatocele is a fluid-filled sac, or a cyst, inside the sac that holds the testicles (scrotum). This type of cyst often forms in the epididymis. The epididymis is a coiled tube at the top of each testicle, and this tube is where sperm are stored. The cyst sometimes forms along a tube called the vas deferens, which is a tube that carries sperm away from the epididymis. Spermatoceles are usually painless. Most cysts are small, but they can grow larger. Spermatoceles are not cancerous (are benign). What are the causes? The cause of this condition is not known. However, this condition usually results from a blockage in one of the many small tubes (tubules) that carry sperm from your testicle to your vas deferens. What are the signs or symptoms? In most cases, small cysts do not cause symptoms. However, symptoms sometimes occur. Symptoms of this condition include:  Dull pain.  A feeling of heaviness.  An enlarged scrotum, if your cyst is large. How is this diagnosed? This condition is diagnosed based on a physical exam.  You or your health care provider may  notice your cyst when feeling your scrotum.  Your health care provider may shine a light through (transilluminate) your scrotum to see if light will pass through your cyst. An ultrasound of the scrotum may also be done to rule out a tumor. How is this treated? Small spermatoceles do not need to be treated. If your spermatocele has grown large or is uncomfortable, your health care provider may recommend surgery to remove it. Follow these instructions at home:  Watch your spermatocele for any changes.  Do regular self-exams of your scrotum.  Keep all follow-up visits as told by your health care  provider. This is important. Contact a health care provider if:  Your spermatocele gets larger.  You have pain in your scrotum.  Your spermatocele comes back after treatment. Get help right away if:  You experience severe pain and redness of your scrotum. Summary  A spermatocele is a fluid-filled sac, or a cyst, inside the sac that holds the testicles (scrotum). This condition is usually painless, and it is not cancerous (is benign).  Your health care provider may recommend surgery to remove your spermatocele if it grows large or is uncomfortable.  If you have a spermatocele, watch for any changes and do self-exams of your scrotum.  Keep all follow-up visits as told by your health care provider. This is important. This information is not intended to replace advice given to you by your health care provider. Make sure you discuss any questions you have with your health care provider. Document Revised: 03/25/2019 Document Reviewed: 03/25/2019 Elsevier Patient Education  2021 [employer].

## 2020-10-05 ENCOUNTER — Ambulatory Visit: Payer: BC Managed Care – PPO | Admitting: Internal Medicine
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
## 2020-10-07 NOTE — Patient Instructions (Signed)
Epididymitis  Epididymitis is swelling (inflammation) or infection of the epididymis. The epididymis is a cord-like structure that is located along the top and back part of the testicle. It collects and stores sperm from the testicle. This condition can also cause pain and swelling of the testicle and scrotum. Symptoms usually start suddenly (acute epididymitis). Sometimes epididymitis starts gradually and lasts for a while (chronic epididymitis). This type may be harder to treat.  What are the causes?  In men ages 73-40, this condition is usually caused by a bacterial infection or a sexually transmitted disease (STD), such as:  Gonorrhea.  Chlamydia. In men 79 and older who do not have anal sex, this condition is usually caused by bacteria from a blockage or from abnormalities in the urinary system. These can result from:  Having a tube placed into the bladder (urinary catheter).  Having an enlarged or inflamed prostate gland.  Having recently had urinary tract surgery.  Having a problem with a backward flow of urine (retrograde). In men who have a condition that weakens the body's defense system (immune system), such as HIV, this condition can be caused by:  Other bacteria, including tuberculosis and syphilis.  Viruses.  Fungi. Sometimes this condition occurs without infection. This may happen because of trauma or repetitive activities such as sports.  What increases the risk?  You are more likely to develop this condition if you have:  Unprotected sex with more than one partner.  Anal sex.  Recently had surgery.  A urinary catheter.  Urinary problems.  A suppressed immune system.   What are the signs or symptoms?  This condition usually begins suddenly with chills, fever, and pain behind the scrotum and in the testicle. Other symptoms include:  Swelling of the scrotum, testicle, or both.  Pain when ejaculating or urinating.  Pain in the back or  abdomen.  Nausea.  Itching and discharge from the penis.  A frequent need to pass urine.  Redness, increased warmth, and tenderness of the scrotum.   How is this diagnosed?  Your health care provider can diagnose this condition based on your symptoms and medical history. Your health care provider will also do a physical exam to ask about your symptoms and check your scrotum and testicle for swelling, pain, and redness. You may also have other tests, including:  Examination of discharge from the penis.  Urine tests for infections, such as STDs.  Ultrasound test for blood flow and inflammation. Your health care provider may test you for other STDs, including HIV. How is this treated? Treatment for this condition depends on the cause. If your condition is caused by a bacterial infection, oral antibiotic medicine may be prescribed. If the bacterial infection has spread to your blood, you may need to receive IV antibiotics. For both bacterial and nonbacterial epididymitis, you may be treated with:  Rest.  Elevation of the scrotum.  Pain medicines.  Anti-inflammatory medicines. Surgery may be needed to treat:  Bacterial epididymitis that causes pus to build up in the scrotum (abscess).  Chronic epididymitis that has not responded to other treatments.   Follow these instructions at home:  Medicines   Take over-the-counter and prescription medicines only as told by your health care provider.  If you were prescribed an antibiotic medicine, take it as told by your health care provider. Do not stop taking the antibiotic even if your condition improves.   Sexual activity   If your epididymitis was caused by an STD, avoid sexual activity  until your treatment is complete.  Inform your sexual partner or partners if you test positive for an STD. They may need to be treated. Do not engage in sexual activity with your partner or partners until their treatment is  completed.   Managing pain and swelling   If directed, elevate your scrotum and apply ice. ? Put ice in a plastic bag. ? Place a small towel or pillow between your legs. ? Rest your scrotum on the pillow or towel. ? Place another towel between your skin and the plastic bag. ? Leave the ice on for 20 minutes, 2-3 times a day.  Try taking a sitz bath to help with discomfort. This is a warm water bath that is taken while you are sitting down. The water should only come up to your hips and should cover your buttocks. Do this 3-4 times per day or as told by your health care provider.  Keep your scrotum elevated and supported while resting. Ask your health care provider if you should wear a scrotal support, such as a jockstrap. Wear it as told by your health care provider.    General instructions   Return to your normal activities as told by your health care provider. Ask your health care provider what activities are safe for you.  Drink enough fluid to keep your urine pale yellow.  Keep all follow-up visits as told by your health care provider. This is important.   Contact a health care provider if:   You have a fever.  Your pain medicine is not helping.  Your pain is getting worse.  Your symptoms do not improve within 3 days.   Summary   Epididymitis is swelling (inflammation) or infection of the epididymis. This condition can also cause pain and swelling of the testicle and scrotum.  Treatment for this condition depends on the cause. If your condition is caused by a bacterial infection, oral antibiotic medicine may be prescribed.    Inform your sexual partner or partners if you test positive for an STD. They may need to be treated. Do not engage in sexual activity with your partner or partners until their treatment is completed.   =============================  Spermatocele  A spermatocele is a fluid-filled sac, or a cyst, inside the sac that holds the testicles  (scrotum). This type of cyst often forms in the epididymis. The epididymis is a coiled tube at the top of each testicle, and this tube is where sperm are stored. The cyst sometimes forms along a tube called the vas deferens, which is a tube that carries sperm away from the epididymis. Spermatoceles are usually painless. Most cysts are small, but they can grow larger. Spermatoceles are not cancerous (are benign).  What are the causes?  The cause of this condition is not known. However, this condition usually results from a blockage in one of the many small tubes (tubules) that carry sperm from your testicle to your vas deferens.  What are the signs or symptoms?  In most cases, small cysts do not cause symptoms. However, symptoms sometimes occur. Symptoms of this condition include:  Dull pain.  A feeling of heaviness.  An enlarged scrotum, if your cyst is large.   How is this diagnosed? This condition is diagnosed based on a physical exam.  You or your health care provider may notice your cyst when feeling your scrotum.  An ultrasound of the scrotum may also be done to rule out a tumor.  How is this treated? \  Small spermatoceles do not need to be treated. If your spermatocele has grown large or is uncomfortable, your health care provider may recommend surgery to remove it. Follow these instructions at home:  Watch your spermatocele for any changes.  Do regular self-exams of your scrotum.  Keep all follow-up visits as told by your health care provider. This is important.   Contact a health care provider if:   Your spermatocele gets larger.  You have pain in your scrotum.  Your spermatocele comes back after treatment.  Get help right away if:  You experience severe pain and redness of your scrotum.  Summary   A spermatocele is a fluid-filled sac, or a cyst, inside the sac that holds the testicles (scrotum). This condition is usually painless, and it is not cancerous  (is benign).  Your health care provider may recommend surgery to remove your spermatocele if it grows large or is uncomfortable.  If you have a spermatocele, watch for any changes and do self-exams of your scrotum.  Keep all follow-up visits as told by your health care provider. This is important.

## 2020-10-08 ENCOUNTER — Encounter: Payer: Self-pay | Admitting: Internal Medicine

## 2020-10-08 ENCOUNTER — Other Ambulatory Visit: Payer: Self-pay

## 2020-10-08 ENCOUNTER — Ambulatory Visit: Payer: BC Managed Care – PPO | Admitting: Internal Medicine

## 2020-10-08 VITALS — BP 130/84 | HR 90 | Temp 97.7°F | Ht 69.0 in | Wt 181.0 lb

## 2020-10-08 DIAGNOSIS — N451 Epididymitis: Secondary | ICD-10-CM | POA: Diagnosis not present

## 2021-04-24 ENCOUNTER — Encounter: Payer: Self-pay | Admitting: Internal Medicine

## 2021-05-23 ENCOUNTER — Encounter: Payer: Self-pay | Admitting: Adult Health

## 2021-11-20 ENCOUNTER — Encounter: Payer: Self-pay | Admitting: Nurse Practitioner

## 2021-11-20 ENCOUNTER — Ambulatory Visit: Payer: 59 | Admitting: Nurse Practitioner

## 2021-11-20 VITALS — BP 128/70 | HR 82 | Temp 97.5°F | Wt 180.2 lb

## 2021-11-20 DIAGNOSIS — J301 Allergic rhinitis due to pollen: Secondary | ICD-10-CM

## 2021-11-20 DIAGNOSIS — H6121 Impacted cerumen, right ear: Secondary | ICD-10-CM

## 2021-11-20 NOTE — Progress Notes (Signed)
Assessment and Plan:  Michael Walker was seen today for ear fullness.  Diagnoses and all orders for this visit:  Seasonal allergic rhinitis due to pollen Advised to use Zyrtec or Allegra on days he rides motorcycle Use Mucinex as needed for congestion  Impacted cerumen of right ear Right ear is irrigated, hardened wax removed- TM WNL      Further disposition pending results of labs. Discussed med's effects and SE's.   Over 30 minutes of exam, counseling, chart review, and critical decision making was performed.   No future appointments.   ------------------------------------------------------------------------------------------------------------------   HPI BP 128/70   Pulse 82   Temp (!) 97.5 F (36.4 C)   Wt 180 lb 3.2 oz (81.7 kg)   SpO2 98%   BMI 26.61 kg/m   25 y.o.male presents for stuffy ears this morning.  Felt like there was water in his ears.  Hearing sounded muffled. Has resolved spontaneously as the day went on. He had went for a motorcycle ride the night before  Past Medical History:  Diagnosis Date   Aortic valve cusp abnormality    Bicuspid aortic valve      No Known Allergies  Current Outpatient Medications on File Prior to Visit  Medication Sig   Multiple Vitamin (MULTIVITAMIN) tablet Take 1 tablet by mouth daily.   No current facility-administered medications on file prior to visit.    ROS: all negative except above.   Physical Exam:  BP 128/70   Pulse 82   Temp (!) 97.5 F (36.4 C)   Wt 180 lb 3.2 oz (81.7 kg)   SpO2 98%   BMI 26.61 kg/m   General Appearance: Well nourished, in no apparent distress. Eyes: PERRLA, EOMs, conjunctiva no swelling or erythema Sinuses: No Frontal/maxillary tenderness ENT/Mouth: Ext aud canals clear, R TM impacted with cerumen- ears were irrigated, hardened wax removed- TM visualized and normal. No erythema, swelling, or exudate on post pharynx.  Tonsils not swollen or erythematous. Hearing normal.  Neck:  Supple, thyroid normal.  Respiratory: Respiratory effort normal, BS equal bilaterally without rales, rhonchi, wheezing or stridor.  Cardio: RRR with no MRGs. Brisk peripheral pulses without edema.  Abdomen: Soft, + BS.  Non tender, no guarding, rebound, hernias, masses. Lymphatics: Non tender without lymphadenopathy.  Musculoskeletal: Full ROM, 5/5 strength, normal gait.  Skin: Warm, dry without rashes, lesions, ecchymosis.  Neuro: Cranial nerves intact. Normal muscle tone, no cerebellar symptoms. Sensation intact.  Psych: Awake and oriented X 3, normal affect, Insight and Judgment appropriate.     Raynelle Dick, NP 2:39 PM [facility]

## 2022-08-20 ENCOUNTER — Ambulatory Visit: Payer: BC Managed Care – PPO | Admitting: Nurse Practitioner

## 2023-09-11 ENCOUNTER — Telehealth: Payer: Self-pay | Admitting: Physician Assistant

## 2023-09-11 DIAGNOSIS — L304 Erythema intertrigo: Secondary | ICD-10-CM

## 2023-09-11 MED ORDER — NYSTATIN 100000 UNIT/GM EX CREA: 1.0000 | TOPICAL_CREAM | Freq: Two times a day (BID) | CUTANEOUS | 0 refills | Status: AC

## 2023-09-11 MED ORDER — NYSTATIN 100000 UNIT/GM EX POWD: 1.0000 | Freq: Three times a day (TID) | CUTANEOUS | 0 refills | Status: AC

## 2023-09-11 NOTE — Progress Notes (Signed)
 Virtual Visit Consent   Michael Walker, you are scheduled for a virtual visit with a [facility] provider today. Just as with appointments in the office, your consent must be obtained to participate. Your consent will be active for this visit and any virtual visit you may have with one of our providers in the next 365 days. If you have a MyChart account, a copy of this consent can be sent to you electronically.  As this is a virtual visit, video technology does not allow for your provider to perform a traditional examination. This may limit your provider's ability to fully assess your condition. If your provider identifies any concerns that need to be evaluated in person or the need to arrange testing (such as labs, EKG, etc.), we will make arrangements to do so. Although advances in technology are sophisticated, we cannot ensure that it will always work on either your end or our end. If the connection with a video visit is poor, the visit may have to be switched to a telephone visit. With either a video or telephone visit, we are not always able to ensure that we have a secure connection.  By engaging in this virtual visit, you consent to the provision of healthcare and authorize for your insurance to be billed (if applicable) for the services provided during this visit. Depending on your insurance coverage, you may receive a charge related to this service.  I need to obtain your verbal consent now. Are you willing to proceed with your visit today? Michael Walker has provided verbal consent on 09/11/2023 for a virtual visit (video or telephone). Michael CHRISTELLA Dickinson, PA-C  Date: 09/11/2023 7:08 PM   Virtual Visit via Video Note   I, Michael Walker, connected with  Michael Walker  ([id number], [birth year]) on 09/11/23 at  6:30 PM EDT by a video-enabled telemedicine application and verified that I am speaking with the correct person using two identifiers.  Location: Patient: Virtual Visit Location  Patient: Home Provider: Virtual Visit Location Provider: Home Office   I discussed the limitations of evaluation and management by telemedicine and the availability of in person appointments. The patient expressed understanding and agreed to proceed.    History of Present Illness: Michael Walker is a 28 y.o. who identifies as a male who was assigned male at birth, and is being seen today for reports having a slimy feeling and itching. Itches more after wiping. Can feel itching all the time. Denies any visual rash. Denies bowel changes. Denies any odor. Denies rectal bleeding, hematochezia, or melena. No visible external hemorrhoid. Does report he can feel an area of irritation that would be around the 12 o'clock positioning of the anus.   Feels the most common trigger he can notice is increased moisture.   Has had this issue in the past and used a cream OTC that only offered temporary relief. Thinks he may have used Bacitracin in the past.   Used baby powder recently and mentions did offer mild relief for a few hours.   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  Resting comfortably at home.  Head is normocephalic, atraumatic.  No labored breathing.  Speech is clear and coherent with logical content.  Patient is alert and oriented at  baseline.    Assessment and Plan: 1. Intertrigo (Primary) - nystatin  cream (MYCOSTATIN ); Apply 1 Application topically 2 (two) times daily.  Dispense: 30 g; Refill: 0 - nystatin  (MYCOSTATIN /NYSTOP ) powder; Apply 1 Application topically 3 (three) times daily.  Dispense: 15 g; Refill: 0  - Limited evaluation since virtual and unable to visualize or examine the area - Will treat for potential intertrigo as mentions moisture is a trigger and baby powder did offer mild relief. - Will provide Nystatin  cream and powder; Use cream first to calm down itching over 3-4 days then transition to using the powder twice daily - Epsom salt soak - Limit heat, take luke warm to cool showers - Do not scrub the area as it can irritate the skin more and increase itching as well - Avoid scented soaps or lotions in the area (less can be more in the genital region, particularly anal region, where water and light washing can be better than scrubbing with a product) - Follow up in person if not improving or if symptoms worsen   Follow Up Instructions: I discussed the assessment and treatment plan with the patient. The patient was provided an opportunity to ask questions and all were answered. The patient agreed with the plan and demonstrated an understanding of the instructions.  A copy of instructions were sent to the patient via MyChart unless otherwise noted below.    The patient was advised to call back or seek an in-person evaluation if the symptoms worsen or if the condition fails to improve as anticipated.    Michael CHRISTELLA Dickinson, PA-C

## 2023-09-11 NOTE — Patient Instructions (Addendum)
 Michael Walker, thank you for joining Michael CHRISTELLA Dickinson, PA-C for today's virtual visit.  While this provider is not your primary care provider (PCP), if your PCP is located in our provider database this encounter information will be shared with them immediately following your visit.   A [facility] MyChart account gives you access to today's visit and all your visits, tests, and labs performed at [facility]  click here if you don't have a [facility] MyChart account or go to [email or web address]  Consent: (Patient) Michael Walker provided verbal consent for this virtual visit at the beginning of the encounter.  Current Medications:  Current Outpatient Medications:    nystatin  (MYCOSTATIN /NYSTOP ) powder, Apply 1 Application topically 3 (three) times daily., Disp: 15 g, Rfl: 0   nystatin  cream (MYCOSTATIN ), Apply 1 Application topically 2 (two) times daily., Disp: 30 g, Rfl: 0   Multiple Vitamin (MULTIVITAMIN) tablet, Take 1 tablet by mouth daily., Disp: , Rfl:    Medications ordered in this encounter:  Meds ordered this encounter  Medications   nystatin  cream (MYCOSTATIN )    Sig: Apply 1 Application topically 2 (two) times daily.    Dispense:  30 g    Refill:  0    Supervising Provider:   LAMPTEY, PHILIP O [[id number]]   nystatin  (MYCOSTATIN /NYSTOP ) powder    Sig: Apply 1 Application topically 3 (three) times daily.    Dispense:  15 g    Refill:  0    Supervising Provider:   BLAISE ALEENE KIDD [[id number]]     *If you need refills on other medications prior to your next appointment, please contact your pharmacy*  Follow-Up: Call back or seek an in-person evaluation if the symptoms worsen or if the condition fails to improve as anticipated.  [facility] [phone]  Other Instructions  Anal Pruritus Anal pruritus is an itchy feeling in the anus and on the skin around the anus. This is common and can be caused by many things. It often occurs when the  area becomes moist. Moisture may be due to sweating or to a small amount of stool (feces) that is left on the area because of poor personal cleaning. Some other causes include: Things that can irritate your skin, such as: Perfumed soaps and sprays. Colored or scented toilet paper. Excessive washing. Certain foods, such as caffeine, beer, and spicy foods. Diarrhea or loose stool. Skin disorders (psoriasis, eczema, or seborrhea). Hemorrhoids, fissures, infections, and other anal diseases. Other medical conditions, such as diabetes, thyroid problems, STIs (sexually transmitted infections), or some cancers. In many cases, the cause is not known. The itching usually goes away with treatment and home care. Scratching can cause further skin damage and make the problem worse. Follow these instructions at home: Skin care     Practice good hygiene. Clean the anal area gently with wet toilet paper or a wet washcloth after every bowel movement and at bedtime. Avoid using soaps on the anal area. Dry the area thoroughly. Pat the area dry with toilet paper or a towel. Do not scrub the anal area with anything, including toilet paper. Do not scratch the itchy area. Scratching causes more damage and makes the itching worse. Take sitz baths as told by your health care provider. A sitz bath is a warm water bath that only comes up to your hips and covers your buttocks. A sitz bath may be done at home in a bathtub or with a portable sitz bath that fits over  the toilet. Pat the area dry with a soft cloth after each bath. Use creams or ointments as told by your health care provider. Zinc oxide ointment or a moisture barrier cream can be applied several times a day to protect and heal the skin. Do not use anything that irritates the skin, such as bubble baths, scented toilet paper, or genital deodorants. General instructions Pay attention to any changes in your symptoms. Take or apply over-the-counter and  prescription medicines only as told by your health care provider. Avoid overusing medicines that help you have a bowel movement (laxatives). These can cause you to have loose stools. Talk with your health care provider about whether you should increase the fiber in your diet. This can help keep your stool normal if you have frequent loose stools. Limit or avoid foods that may cause your symptoms. These may include: Spicy foods, such as salsa, jalapeo peppers, and spicy seasonings. Caffeine or beer. Milk products. Chocolate, nuts, citrus fruits, or tomatoes. Wear cotton underwear and loose clothing. Keep all follow-up visits as told by your health care provider. This is important. Contact a health care provider if: Your itching does not improve in several days. Your itching gets worse. You have a fever. You have redness, swelling, or pain in the anal area. You have fluid, blood, or pus coming from the anal area. Summary Anal pruritus is an itchy feeling in the anus and the skin in the anal area. This can be caused by many things, such as things that irritate your skin and certain medical conditions. Take or apply over-the-counter and prescription medicines only as told by your health care provider. Practice good hygiene as told by your health care provider. Talk with your health care provider about whether you should increase the fiber in your diet. This can help keep your stool normal if you have frequent loose stools. Contact a health care provider if your symptoms get worse or if you develop new symptoms. This information is not intended to replace advice given to you by your health care provider. Make sure you discuss any questions you have with your health care provider. Document Revised: 05/06/2022 Document Reviewed: 03/21/2022 Elsevier Patient Education  2024 [employer] Inc.   Cottie Intertrigo is skin irritation or inflammation (dermatitis) that occurs when folds of skin rub  together. The irritation can cause a rash and make skin raw and itchy. This condition mostly occurs in the skin folds of these areas: Armpits. Under the breasts. Under the abdomen. Groin. Buttocks. Toes. Intertrigo is not passed from person to person (is not contagious). What are the causes? This condition is caused by heat, moisture, rubbing (friction), and not enough air circulation. The condition can be made worse by: Sweat. Bacteria. A fungus, such as yeast. What increases the risk? This condition is more likely to occur if you have moisture in your skin folds. You are more likely to develop this condition if you: Are not able to move around or are not active. Live in a warm and moist climate. Are not able to control your bowels or bladder (have incontinence). Wear splints, braces, or other medical devices. Are overweight. Have diabetes. What are the signs or symptoms? Symptoms of this condition include: A pink or red skin rash in a skin fold or near a skin fold. Raw or scaly skin. Itchiness or burning. Bleeding. Leaking fluid. A bad smell. How is this diagnosed? This condition is diagnosed with a medical history and physical exam. You may also  have a skin swab to test for bacteria or fungus. How is this treated? This condition may be treated by: Cleaning and drying your skin. Taking an antibiotic medicine or using an antibiotic skin cream for a bacterial infection. Using an antifungal cream on your skin or taking pills for an infection that was caused by a fungus, such as yeast. Using a steroid ointment to relieve itchiness and irritation. Separating the skin fold with a clean cotton cloth to absorb moisture and allow air to flow into the area. Follow these instructions at home: Keep the affected area clean and dry. Do not scratch your skin. Stay in a cool environment as much as possible. Use an air conditioner or fan, if available. Apply over-the-counter and  prescription medicines only as told by your health care provider. If you were prescribed antibiotics, use them as told by your health care provider. Do not stop using the antibiotic even if you start to feel better. Keep all follow-up visits. Your health care provider may need to check how well your skin is responding to the treatment. How is this prevented? Shower and dry yourself well after activity or exercise. Use a hair dryer on a cool setting to dry between skin folds, especially after you bathe. Do not wear tight clothes. Wear clothes that are loose, absorbent, and made of cotton. Wear a bra that gives good support, if needed. Protect the skin around your groin and buttocks, especially if you have incontinence. Skin protection includes: Following a regular cleaning routine. Using skin protectant creams, powders, or ointments. Changing protection pads frequently. Maintain a healthy weight. Take care of your feet, especially if you have diabetes. Foot care includes: Wearing shoes that fit well. Keeping your feet dry. Wearing clean, breathable socks. If you have diabetes, keep your blood sugar under control. Contact a health care provider if: Your symptoms do not improve with treatment. Your symptoms get worse or they spread. You notice increased redness and warmth. You have a fever. This information is not intended to replace advice given to you by your health care provider. Make sure you discuss any questions you have with your health care provider. Document Revised: 10/03/2021 Document Reviewed: 10/03/2021 Elsevier Patient Education  2024 Elsevier Inc.   If you have been instructed to have an in-person evaluation today at a local Urgent Care facility, please use the link below. It will take you to a list of all of our available [facility], including address, phone number and hours of operation. Please do not delay care.  [facility]  If you or a family  member do not have a primary care provider, use the link below to schedule a visit and establish care. When you choose a [facility] primary care physician or advanced practice provider, you gain a long-term partner in health. Find a Primary Care Provider  Learn more about [facility]'s in-office and virtual care options: [facility] - Get Care Now

## 2024-03-11 ENCOUNTER — Encounter

## 2024-03-11 ENCOUNTER — Telehealth: Admitting: Physician Assistant

## 2024-03-11 ENCOUNTER — Encounter: Payer: Self-pay | Admitting: Physician Assistant

## 2024-03-11 DIAGNOSIS — H6502 Acute serous otitis media, left ear: Secondary | ICD-10-CM

## 2024-03-11 DIAGNOSIS — H6993 Unspecified Eustachian tube disorder, bilateral: Secondary | ICD-10-CM

## 2024-03-11 MED ORDER — FLUTICASONE PROPIONATE 50 MCG/ACT NA SUSP: 2.0000 | Freq: Every day | NASAL | 0 refills | Status: DC

## 2024-03-11 MED ORDER — FLUTICASONE PROPIONATE 50 MCG/ACT NA SUSP: 2.0000 | Freq: Every day | NASAL | 0 refills | Status: AC

## 2024-03-11 MED ORDER — CIPROFLOXACIN-DEXAMETHASONE 0.3-0.1 % OT SUSP: 4.0000 [drp] | Freq: Two times a day (BID) | OTIC | 0 refills | Status: DC

## 2024-03-11 MED ORDER — CIPROFLOXACIN-DEXAMETHASONE 0.3-0.1 % OT SUSP: 4.0000 [drp] | Freq: Two times a day (BID) | OTIC | 0 refills | Status: AC
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
## 2024-03-11 NOTE — Addendum Note (Signed)
 Addended by: VIVIENNE DELON HERO on: 03/11/2024 06:59 PM   Modules accepted: Orders

## 2024-03-11 NOTE — Patient Instructions (Signed)
 Michael Walker, thank you for joining Michael Walker Dickinson, PA-C for today's virtual visit.  While this provider is not your primary care provider (PCP), if your PCP is located in our provider database this encounter information will be shared with them immediately following your visit.   A Michael Walker MyChart account gives you access to today's visit and all your visits, tests, and labs performed at [facility]  click here if you don't have a Michael Walker MyChart account or go to [email or web address]  Consent: (Patient) Michael Walker provided verbal consent for this virtual visit at the beginning of the encounter.  Current Medications:  Current Outpatient Medications:    ciprofloxacin-dexamethasone (CIPRODEX) OTIC suspension, Place 4 drops into the left ear 2 (two) times daily for 7 days., Disp: 7.5 mL, Rfl: 0   fluticasone (FLONASE) 50 MCG/ACT nasal spray, Place 2 sprays into both nostrils daily., Disp: 16 g, Rfl: 0   Multiple Vitamin (MULTIVITAMIN) tablet, Take 1 tablet by mouth daily., Disp: , Rfl:    nystatin  (MYCOSTATIN /NYSTOP ) powder, Apply 1 Application topically 3 (three) times daily., Disp: 15 g, Rfl: 0   nystatin  cream (MYCOSTATIN ), Apply 1 Application topically 2 (two) times daily., Disp: 30 g, Rfl: 0   Medications ordered in this encounter:  Meds ordered this encounter  Medications   DISCONTD: fluticasone (FLONASE) 50 MCG/ACT nasal spray    Sig: Place 2 sprays into both nostrils daily.    Dispense:  16 g    Refill:  0    Supervising Provider:   BLAISE ALEENE Walker [[id number]]   DISCONTD: ciprofloxacin-dexamethasone (CIPRODEX) OTIC suspension    Sig: Place 4 drops into the left ear 2 (two) times daily for 7 days.    Dispense:  7.5 mL    Refill:  0    Supervising Provider:   LAMPTEY, PHILIP Walker [[id number]]   ciprofloxacin-dexamethasone (CIPRODEX) OTIC suspension    Sig: Place 4 drops into the left ear 2 (two) times daily for 7 days.    Dispense:  7.5 mL    Refill:   0    Supervising Provider:   BLAISE ALEENE Walker [[id number]]   fluticasone (FLONASE) 50 MCG/ACT nasal spray    Sig: Place 2 sprays into both nostrils daily.    Dispense:  16 g    Refill:  0    Supervising Provider:   BLAISE ALEENE Walker [[id number]]     *If you need refills on other medications prior to your next appointment, please contact your pharmacy*  Follow-Up: Call back or seek an in-person evaluation if the symptoms worsen or if the condition fails to improve as anticipated.  [facility] [phone]  Other Instructions  Otitis Externa  Otitis externa is an infection of the outer ear canal. The outer ear canal is the area between the outside of the ear and the eardrum. Otitis externa is sometimes called swimmer's ear. What are the causes? Common causes of this condition include: Swimming in dirty water. Moisture in the ear. An injury to the inside of the ear. An object stuck in the ear. A cut or scrape on the outside of the ear or in the ear canal. What increases the risk? You are more likely to develop this condition if you go swimming often. What are the signs or symptoms? The first symptom of this condition is often itching in the ear. Later symptoms of the condition include: Swelling of the ear. Redness in the ear. Ear pain. The pain may  get worse when you pull on your ear. Pus coming from the ear. How is this diagnosed? This condition may be diagnosed by examining the ear and testing fluid from the ear for bacteria and funguses. How is this treated? This condition may be treated with: Antibiotic ear drops. These are often given for 10-14 days. Medicines to reduce itching and swelling. Follow these instructions at home: If you were prescribed antibiotic ear drops, use them as told by your health care provider. Do not stop using the antibiotic even if you start to feel better. Take over-the-counter and prescription medicines only as told by your health  care provider. Avoid getting water in your ears as told by your health care provider. This may include avoiding swimming or water sports for a few days. Keep all follow-up visits. This is important. How is this prevented? Keep your ears dry. Use the corner of a towel to dry your ears after you swim or bathe. Avoid scratching or putting things in your ear. Doing these things can damage the ear canal or remove the protective wax that lines it, which makes it easier for bacteria and funguses to grow. Avoid swimming in lakes, polluted water, or swimming pools that may not have enough chlorine. Contact a health care provider if: You have a fever. Your ear is still red, swollen, painful, or draining pus after 3 days. Your redness, swelling, or pain gets worse. You have a severe headache. Get help right away if: You have redness, swelling, and pain or tenderness in the area behind your ear. Summary Otitis externa is an infection of the outer ear canal. Common causes include swimming in dirty water, moisture in the ear, or a cut or scrape in the ear. Symptoms include pain, redness, and swelling of the ear canal. If you were prescribed antibiotic ear drops, use them as told by your health care provider. Do not stop using the antibiotic even if you start to feel better. This information is not intended to replace advice given to you by your health care provider. Make sure you discuss any questions you have with your health care provider. Document Revised: 07/25/2020 Document Reviewed: 07/25/2020 Elsevier Patient Education  2024 [employer] Inc.    Eustachian Tube Dysfunction  Eustachian tube dysfunction refers to a condition in which a blockage develops in the narrow passage that connects the middle ear to the back of the nose (eustachian tube). The eustachian tube regulates air pressure in the middle ear by letting air move between the ear and nose. It also helps to drain fluid from the middle ear  space. Eustachian tube dysfunction can affect one or both ears. When the eustachian tube does not function properly, air pressure, fluid, or both can build up in the middle ear. What are the causes? This condition occurs when the eustachian tube becomes blocked or cannot open normally. Common causes of this condition include: Ear infections. Colds and other infections that affect the nose, mouth, and throat (upper respiratory tract). Allergies. Irritation from cigarette smoke. Irritation from stomach acid coming up into the esophagus (gastroesophageal reflux). The esophagus is the part of the body that moves food from the mouth to the stomach. Sudden changes in air pressure, such as from descending in an airplane or scuba diving. Abnormal growths in the nose or throat, such as: Growths that line the nose (nasal polyps). Abnormal growth of cells (tumors). Enlarged tissue at the back of the throat (adenoids). What increases the risk? You are more likely  to develop this condition if: You smoke. You are overweight. You are a child who has: Certain birth defects of the mouth, such as cleft palate. Large tonsils or adenoids. What are the signs or symptoms? Common symptoms of this condition include: A feeling of fullness in the ear. Ear pain. Clicking or popping noises in the ear. Ringing in the ear (tinnitus). Hearing loss. Loss of balance. Dizziness. Symptoms may get worse when the air pressure around you changes, such as when you travel to an area of high elevation, fly on an airplane, or go scuba diving. How is this diagnosed? This condition may be diagnosed based on: Your symptoms. A physical exam of your ears, nose, and throat. Tests, such as those that measure: The movement of your eardrum. Your hearing (audiometry). How is this treated? Treatment depends on the cause and severity of your condition. In mild cases, you may relieve your symptoms by moving air into your ears.  This is called popping the ears. In more severe cases, or if you have symptoms of fluid in your ears, treatment may include: Medicines to relieve congestion (decongestants). Medicines that treat allergies (antihistamines). Nasal sprays or ear drops that contain medicines that reduce swelling (steroids). A procedure to drain the fluid in your eardrum. In this procedure, a small tube may be placed in the eardrum to: Drain the fluid. Restore the air in the middle ear space. A procedure to insert a balloon device through the nose to inflate the opening of the eustachian tube (balloon dilation). Follow these instructions at home: Lifestyle Do not do any of the following until your health care provider approves: Travel to high altitudes. Fly in airplanes. Work in a Estate agent or room. Scuba dive. Do not use any products that contain nicotine or tobacco. These products include cigarettes, chewing tobacco, and vaping devices, such as e-cigarettes. If you need help quitting, ask your health care provider. Keep your ears dry. Wear fitted earplugs during showering and bathing. Dry your ears completely after. General instructions Take over-the-counter and prescription medicines only as told by your health care provider. Use techniques to help pop your ears as recommended by your health care provider. These may include: Chewing gum. Yawning. Frequent, forceful swallowing. Closing your mouth, holding your nose closed, and gently blowing as if you are trying to blow air out of your nose. Keep all follow-up visits. This is important. Contact a health care provider if: Your symptoms do not go away after treatment. Your symptoms come back after treatment. You are unable to pop your ears. You have: A fever. Pain in your ear. Pain in your head or neck. Fluid draining from your ear. Your hearing suddenly changes. You become very dizzy. You lose your balance. Get help right away if: You  have a sudden, severe increase in any of your symptoms. Summary Eustachian tube dysfunction refers to a condition in which a blockage develops in the eustachian tube. It can be caused by ear infections, allergies, inhaled irritants, or abnormal growths in the nose or throat. Symptoms may include ear pain or fullness, hearing loss, or ringing in the ears. Mild cases are treated with techniques to unblock the ears, such as yawning or chewing gum. More severe cases are treated with medicines or procedures. This information is not intended to replace advice given to you by your health care provider. Make sure you discuss any questions you have with your health care provider. Document Revised: 07/23/2020 Document Reviewed: 07/23/2020 Elsevier Patient Education  2024 Elsevier Inc.   If you have been instructed to have an in-person evaluation today at a local Urgent Care facility, please use the link below. It will take you to a list of all of our available [facility], including address, phone number and hours of operation. Please do not delay care.  [facility]  If you or a family member do not have a primary care provider, use the link below to schedule a visit and establish care. When you choose a [facility] primary care physician or advanced practice provider, you gain a long-term partner in health. Find a Primary Care Provider  Learn more about [facility]'s in-office and virtual care options: [facility] Get Care Now

## 2024-03-13 ENCOUNTER — Other Ambulatory Visit: Payer: Self-pay | Admitting: Family

## 2024-03-13 MED ORDER — PREDNISONE 10 MG (21) PO TBPK: ORAL_TABLET | ORAL | 0 refills | Status: AC

## 2024-03-29 ENCOUNTER — Ambulatory Visit
Admission: RE | Admit: 2024-03-29 | Discharge: 2024-03-29 | Disposition: A | Discharge status: Home / Self Care | Source: Ambulatory Visit | Attending: Emergency Medicine | Admitting: Emergency Medicine

## 2024-03-29 VITALS — BP 136/80 | HR 78 | Temp 98.4°F | Resp 18

## 2024-03-29 DIAGNOSIS — J0140 Acute pansinusitis, unspecified: Secondary | ICD-10-CM

## 2024-03-29 MED ORDER — AMOXICILLIN-POT CLAVULANATE 875-125 MG PO TABS: 1.0000 | ORAL_TABLET | Freq: Two times a day (BID) | ORAL | 0 refills | Status: AC

## 2024-03-29 NOTE — Discharge Instructions (Signed)
 Your evaluated for your persisting and reoccurring ear fullness which I believe is related to your sinuses based on all of your symptoms together  Take Augmentin  twice daily for 7 days it will get rid of any bacteria causing symptoms to linger  You may take Tylenol and or Motrin as needed for any pain  You may help warm compresses to your outer ear  At this time you do not need to complete any ear cleaning as there is not a significant amount of wax within the ears  You may take over-the-counter medicines for congestion such as nasal spray, Mucinex and decongestants to further help reduce congestion and reduce pressure within the ears  If you continue to have symptoms you may follow-up with the urgent care, may also follow-up with ear nose and throat specialist whose information is on from

## 2024-03-29 NOTE — ED Triage Notes (Signed)
 Ear fullness, pressure x 1 month. Got prescribed nasal spray and ear drops on a evisit, then did another evisit got prescribed steroids just finished on Thursday.   Pt states he started to fell better with deceased fullness but now having a pain in his ear canal and wants to make sure its not infected.

## 2024-03-29 NOTE — ED Provider Notes (Signed)
 Michael Walker    CSN: [id number] Arrival date & time: 03/29/24  1144      History   Chief Complaint Chief Complaint  Patient presents with   Ear Fullness    Refer to mychart messages - Entered by patient    HPI Michael Walker is a 28 y.o. male.   Patient presents for evaluation of ear fullness, mild ear pain to the tragus and sensation of fluid present for 5 days, rating 1 out of 10.  Initial symptoms began 1 month ago, pain at that time 3 out of 10, present behind the ear.  Fullness, muffled hearing and the inability to pop the ear was present.  Completed e-visit and was prescribed eardrops which she believes are acetic acid, nasal spray, symptoms continue to persist completed second ED visit and was prescribed prednisone, symptoms resolved after treatment before reoccurrence.  Has been experiencing congestion intermittent mild headaches to the frontal aspect as well as intermittent sinus pressure to the forehead into the left cheek.  Denies sore throat.  Past Medical History:  Diagnosis Date   Aortic valve cusp abnormality    Bicuspid aortic valve     Patient Active Problem List   Diagnosis Date Noted   Seasonal allergies 05/23/2020   Labile blood pressure 05/24/2019   Vitamin D  deficiency 05/24/2019   Former smoker 05/23/2019   Bicuspid aortic valve 11/16/2018    Past Surgical History:  Procedure Laterality Date   WISDOM TOOTH EXTRACTION         Home Medications    Prior to Admission medications   Medication Sig Start Date End Date Taking? Authorizing Provider  amoxicillin -clavulanate (AUGMENTIN ) 875-125 MG tablet Take 1 tablet by mouth every 12 (twelve) hours. 03/29/24  Yes Claudean Leavelle R, NP  fluticasone (FLONASE) 50 MCG/ACT nasal spray Place 2 sprays into both nostrils daily. 03/11/24  Yes Vivienne Delon HERO, PA-C  predniSONE (STERAPRED UNI-PAK 21 TAB) 10 MG (21) TBPK tablet Use as directed 03/13/24  Yes Hawks, Christy A, FNP  Multiple Vitamin  (MULTIVITAMIN) tablet Take 1 tablet by mouth daily.    [provider]  nystatin  (MYCOSTATIN /NYSTOP ) powder Apply 1 Application topically 3 (three) times daily. 09/11/23   Burnette, Jennifer M, PA-C  nystatin  cream (MYCOSTATIN ) Apply 1 Application topically 2 (two) times daily. 09/11/23   Vivienne Delon HERO, PA-C    Family History Family History  Problem Relation Age of Onset   Diabetes Mother    Diabetes Maternal Grandmother    Diabetes Paternal Uncle     Social History Social History   Tobacco Use   Smoking status: Every Day    Types: E-cigarettes    Last attempt to quit: 01/17/2019    Years since quitting: 5.2   Smokeless tobacco: Never  Vaping Use   Vaping status: Former   Substances: Nicotine   Devices: Juel  Substance Use Topics   Alcohol use: Yes    Alcohol/week: 7.0 standard drinks of alcohol    Types: 7 Cans of beer per week   Drug use: Not Currently    Types: Marijuana    Comment: In high school, none since 2019      Allergies   Patient has no known allergies.   Review of Systems Review of Systems   Physical Exam Triage Vital Signs ED Triage Vitals  Encounter Vitals Group     BP 03/29/24 1219 136/80     Girls Systolic BP Percentile --      Girls Diastolic BP  Percentile --      Boys Systolic BP Percentile --      Boys Diastolic BP Percentile --      Pulse Rate 03/29/24 1219 78     Resp 03/29/24 1219 18     Temp 03/29/24 1219 98.4 F (36.9 C)     Temp Source 03/29/24 1219 Oral     SpO2 03/29/24 1219 96 %     Weight --      Height --      Head Circumference --      Peak Flow --      Pain Score 03/29/24 1220 1     Pain Loc --      Pain Education --      Exclude from Growth Chart --    No data found.  Updated Vital Signs BP 136/80 (BP Location: Right Arm)   Pulse 78   Temp 98.4 F (36.9 C) (Oral)   Resp 18   SpO2 96%   Visual Acuity Right Eye Distance:   Left Eye Distance:   Bilateral Distance:    Right Eye Near:   Left  Eye Near:    Bilateral Near:     Physical Exam Constitutional:      Appearance: Normal appearance.  HENT:     Right Ear: Tympanic membrane, ear canal and external ear normal.     Left Ear: Tympanic membrane, ear canal and external ear normal.     Nose: Congestion present.     Right Sinus: No maxillary sinus tenderness or frontal sinus tenderness.     Left Sinus: No maxillary sinus tenderness or frontal sinus tenderness.  Neurological:     Mental Status: He is alert.      UC Treatments / Results  Labs (all labs ordered are listed, but only abnormal results are displayed) Labs Reviewed - No data to display  EKG   Radiology No results found.  Procedures Procedures (including critical care time)  Medications Ordered in UC Medications - No data to display  Initial Impression / Assessment and Plan / UC Course  I have reviewed the triage vital signs and the nursing notes.  Pertinent labs & imaging results that were available during my care of the patient were reviewed by me and considered in my medical decision making (see chart for details).  Acute nonrecurrent pansinusitis  Patient symptomology concerning for a sinus infection even though no tenderness noted on exam at this time, no abnormality to the ear indicating cause of symptoms, discussed this with patient, will initiate antibiotics as symptoms began 1 month ago, prescribed Augmentin , recommended use of over-the-counter decongestants and mucolytic's, may use over-the-counter analgesics for management of pain and advised follow-up with urgent care or ear nose and throat specialist if symptoms continue to persist Final Clinical Impressions(s) / UC Diagnoses   Final diagnoses:  Acute non-recurrent pansinusitis     Discharge Instructions      Your evaluated for your persisting and reoccurring ear fullness which I believe is related to your sinuses based on all of your symptoms together  Take Augmentin  twice daily  for 7 days it will get rid of any bacteria causing symptoms to linger  You may take Tylenol and or Motrin as needed for any pain  You may help warm compresses to your outer ear  At this time you do not need to complete any ear cleaning as there is not a significant amount of wax within the ears  You may take over-the-counter  medicines for congestion such as nasal spray, Mucinex and decongestants to further help reduce congestion and reduce pressure within the ears  If you continue to have symptoms you may follow-up with the urgent care, may also follow-up with ear nose and throat specialist whose information is on from   ED Prescriptions     Medication Sig Dispense Auth. Provider   amoxicillin -clavulanate (AUGMENTIN ) 875-125 MG tablet Take 1 tablet by mouth every 12 (twelve) hours. 14 tablet Mykale Gandolfo R, NP      PDMP not reviewed this encounter.   Teresa Shelba SAUNDERS, NP 03/29/24 1252
